# Patient Record
Sex: Male | Born: 1950 | Race: White | Hispanic: No | Marital: Married | Smoking: Never smoker
Health system: Southern US, Community
[De-identification: ages and names within clinical notes are randomized; demographics above are authoritative.]

## PROBLEM LIST (undated history)

## (undated) DIAGNOSIS — N200 Calculus of kidney: Secondary | ICD-10-CM

## (undated) DIAGNOSIS — E119 Type 2 diabetes mellitus without complications: Secondary | ICD-10-CM

## (undated) HISTORY — PX: APPENDECTOMY: SHX54

---

## 2013-01-04 ENCOUNTER — Emergency Department (HOSPITAL_COMMUNITY)
Admission: EM | Admit: 2013-01-04 | Discharge: 2013-01-04 | Disposition: A | Discharge status: Home / Self Care | Payer: PRIVATE HEALTH INSURANCE | Attending: Emergency Medicine | Admitting: Emergency Medicine

## 2013-01-04 ENCOUNTER — Emergency Department (HOSPITAL_COMMUNITY): Payer: PRIVATE HEALTH INSURANCE

## 2013-01-04 ENCOUNTER — Encounter (HOSPITAL_COMMUNITY): Payer: Self-pay | Admitting: *Deleted

## 2013-01-04 DIAGNOSIS — Z7982 Long term (current) use of aspirin: Secondary | ICD-10-CM | POA: Insufficient documentation

## 2013-01-04 DIAGNOSIS — E119 Type 2 diabetes mellitus without complications: Secondary | ICD-10-CM | POA: Insufficient documentation

## 2013-01-04 DIAGNOSIS — Z79899 Other long term (current) drug therapy: Secondary | ICD-10-CM | POA: Insufficient documentation

## 2013-01-04 DIAGNOSIS — N201 Calculus of ureter: Secondary | ICD-10-CM | POA: Insufficient documentation

## 2013-01-04 DIAGNOSIS — Z9089 Acquired absence of other organs: Secondary | ICD-10-CM | POA: Insufficient documentation

## 2013-01-04 HISTORY — DX: Calculus of kidney: N20.0

## 2013-01-04 HISTORY — DX: Type 2 diabetes mellitus without complications: E11.9

## 2013-01-04 LAB — CBC WITH DIFFERENTIAL/PLATELET
Basophils Absolute: 0 10*3/uL (ref 0.0–0.1)
Basophils Relative: 0 % (ref 0–1)
HCT: 39.9 % (ref 39.0–52.0)
Hemoglobin: 14.4 g/dL (ref 13.0–17.0)
Lymphs Abs: 0.7 10*3/uL (ref 0.7–4.0)
MCV: 83.6 fL (ref 78.0–100.0)
Monocytes Relative: 4 % (ref 3–12)
Neutro Abs: 10.6 10*3/uL — ABNORMAL HIGH (ref 1.7–7.7)
RDW: 13.9 % (ref 11.5–15.5)
WBC: 11.8 10*3/uL — ABNORMAL HIGH (ref 4.0–10.5)

## 2013-01-04 LAB — COMPREHENSIVE METABOLIC PANEL
Albumin: 4.3 g/dL (ref 3.5–5.2)
Alkaline Phosphatase: 83 U/L (ref 39–117)
BUN: 24 mg/dL — ABNORMAL HIGH (ref 6–23)
CO2: 24 mEq/L (ref 19–32)
Chloride: 99 mEq/L (ref 96–112)
Creatinine, Ser: 1.53 mg/dL — ABNORMAL HIGH (ref 0.50–1.35)
GFR calc Af Amer: 54 mL/min — ABNORMAL LOW (ref >90)
GFR calc non Af Amer: 47 mL/min — ABNORMAL LOW (ref >90)
Glucose, Bld: 353 mg/dL — ABNORMAL HIGH (ref 70–99)
Total Bilirubin: 1.1 mg/dL (ref 0.3–1.2)

## 2013-01-04 MED ORDER — TAMSULOSIN HCL 0.4 MG PO CAPS: 0.4000 mg | ORAL_CAPSULE | Freq: Every day | ORAL | Status: DC

## 2013-01-04 MED ORDER — ONDANSETRON HCL 4 MG/2ML IJ SOLN
INTRAMUSCULAR | Status: AC
  Filled 2013-01-04: qty 2

## 2013-01-04 MED ORDER — IBUPROFEN 600 MG PO TABS: 600.0000 mg | ORAL_TABLET | Freq: Three times a day (TID) | ORAL | Status: DC | PRN

## 2013-01-04 MED ORDER — ONDANSETRON 8 MG PO TBDP: 8.0000 mg | ORAL_TABLET | Freq: Three times a day (TID) | ORAL | Status: DC | PRN

## 2013-01-04 MED ORDER — FENTANYL CITRATE 0.05 MG/ML IJ SOLN
50.0000 ug | Freq: Once | INTRAMUSCULAR | Status: AC
  Administered 2013-01-04: 50 ug via INTRAVENOUS

## 2013-01-04 MED ORDER — MORPHINE SULFATE 4 MG/ML IJ SOLN
4.0000 mg | Freq: Once | INTRAMUSCULAR | Status: AC
  Administered 2013-01-04: 4 mg via INTRAVENOUS
  Filled 2013-01-04: qty 1

## 2013-01-04 MED ORDER — OXYCODONE-ACETAMINOPHEN 5-325 MG PO TABS: 1.0000 | ORAL_TABLET | ORAL | Status: DC | PRN

## 2013-01-04 MED ORDER — FENTANYL CITRATE 0.05 MG/ML IJ SOLN
INTRAMUSCULAR | Status: AC
  Filled 2013-01-04: qty 2

## 2013-01-04 MED ORDER — ONDANSETRON HCL 4 MG/2ML IJ SOLN
4.0000 mg | Freq: Once | INTRAMUSCULAR | Status: AC
  Administered 2013-01-04: 4 mg via INTRAVENOUS

## 2013-01-04 NOTE — ED Notes (Signed)
Pt stated that he has a hx of kidney stones and has been trying to pass one for 2 days; pain has progressively gotten worse over the last few hours; c/o N/V at present.

## 2013-01-04 NOTE — ED Provider Notes (Signed)
History     CSN: [id number]  Arrival date & time 01/04/13  0046   First MD Initiated Contact with Patient 01/04/13 0115      Chief Complaint  Patient presents with  . Nephrolithiasis    The history is provided by the patient.   patient reports worsening left lower groin pain with radiation to his left flank over the past 2 days.  He has a long-standing history kidney stones and states this feels similar.  His pain worsened this evening and he developed severe nausea and vomiting.  He has no urinary complaints.  No fevers or chills.  His vomitus nonbloody nonbilious.  He states has not seen a urologist in many years regarding this.  His pain at this time is moderate to severe.  Nothing improves or worsens his pain.  Past Medical History  Diagnosis Date  . Kidney stones   . Diabetes mellitus without complication     Past Surgical History  Procedure Laterality Date  . Appendectomy      No family history on file.  History  Substance Use Topics  . Smoking status: Never Smoker   . Smokeless tobacco: Not on file  . Alcohol Use: No      Review of Systems  All other systems reviewed and are negative.    Allergies  Review of patient's allergies indicates no known allergies.  Home Medications   Current Outpatient Rx  Name  Route  Sig  Dispense  Refill  . aspirin EC 81 MG tablet   Oral   Take 81 mg by mouth every morning.         . Cinnamon 500 MG capsule   Oral   Take 500 mg by mouth every morning.         . fish oil-omega-3 fatty acids 1000 MG capsule   Oral   Take 1 g by mouth every morning.         Marland Kitchen glipiZIDE (GLUCOTROL) 5 MG tablet   Oral   Take 10 mg by mouth 2 (two) times daily before a meal.         . metFORMIN (GLUCOPHAGE) 500 MG tablet   Oral   Take 500 mg by mouth every morning.         . niacin 500 MG tablet   Oral   Take 500 mg by mouth every morning.         Marland Kitchen PRAVASTATIN SODIUM PO   Oral   Take 1 tablet by mouth every  evening.         . vitamin C (ASCORBIC ACID) 500 MG tablet   Oral   Take 500 mg by mouth every morning.         Marland Kitchen ibuprofen (ADVIL,MOTRIN) 600 MG tablet   Oral   Take 1 tablet (600 mg total) by mouth every 8 (eight) hours as needed for pain.   15 tablet   0   . ondansetron (ZOFRAN ODT) 8 MG disintegrating tablet   Oral   Take 1 tablet (8 mg total) by mouth every 8 (eight) hours as needed for nausea.   10 tablet   0   . oxyCODONE-acetaminophen (PERCOCET/ROXICET) 5-325 MG per tablet   Oral   Take 1 tablet by mouth every 4 (four) hours as needed for pain.   25 tablet   0   . tamsulosin (FLOMAX) 0.4 MG CAPS   Oral   Take 1 capsule (0.4 mg total) by mouth daily.  10 capsule   0     BP 189/107  Pulse 89  Temp(Src) 97.6 F (36.4 C) (Oral)  Resp 22  SpO2 100%  Physical Exam  Nursing note and vitals reviewed. Constitutional: He is oriented to person, place, and time. He appears well-developed and well-nourished.  HENT:  Head: Normocephalic and atraumatic.  Eyes: EOM are normal.  Neck: Normal range of motion.  Cardiovascular: Normal rate, regular rhythm, normal heart sounds and intact distal pulses.   Pulmonary/Chest: Effort normal and breath sounds normal. No respiratory distress.  Abdominal: Soft. He exhibits no distension. There is no tenderness.  Musculoskeletal: Normal range of motion.  Neurological: He is alert and oriented to person, place, and time.  Skin: Skin is warm and dry.  Psychiatric: He has a normal mood and affect. Judgment normal.    ED Course  Procedures (including critical care time)  Labs Reviewed  CBC WITH DIFFERENTIAL - Abnormal; Notable for the following:    WBC 11.8 (*)    MCHC 36.1 (*)    Platelets 113 (*)    Neutrophils Relative 90 (*)    Lymphocytes Relative 6 (*)    Neutro Abs 10.6 (*)    All other components within normal limits  COMPREHENSIVE METABOLIC PANEL - Abnormal; Notable for the following:    Glucose, Bld 353 (*)     BUN 24 (*)    Creatinine, Ser 1.53 (*)    GFR calc non Af Amer 47 (*)    GFR calc Af Amer 54 (*)    All other components within normal limits  URINALYSIS, ROUTINE W REFLEX MICROSCOPIC   Ct Abdomen Pelvis Wo Contrast  01/04/2013  *RADIOLOGY REPORT*  Clinical Data: Left flank pain and nausea and vomiting.  History kidney stones.  CT ABDOMEN AND PELVIS WITHOUT CONTRAST  Technique:  Multidetector CT imaging of the abdomen and pelvis was performed following the standard protocol without intravenous contrast.  Comparison: None.  Findings: There are two 5 mm stones in the distal left ureter causing left hydronephrosis. The stones are approximately 4-5 cm proximal to the ureterovesicle junction.  There is prominent left hydronephrosis with perinephric soft tissue stranding.  Numerous bilateral renal calculi.  The liver, spleen, pancreas, and biliary tree are normal except for several tiny benign-appearing cysts in this liver. Bilateral adrenal hyperplasia.  The bowel is normal except for multiple diverticula in the left side of the colon.  Appendix has been removed.  No free air or free fluid.  No acute osseous abnormality.  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  IMPRESSION:  1.  Two 5 mm stones obstructing the distal left ureter. 2.  Numerous bilateral renal calculi.   Original Report Authenticated By: Francene Boyers, M.D.      1. Ureteral stone       MDM  Appears to be left-sided ureteral colic.  Pain treated.  CT scan pending.  3:26 AM Feels better at this time. Home with urology follow up.  No urine sample given in the emergency department however his had no urinary symptoms.  Because he feels much better at this time and has no urinary symptoms I don't think is necessary to have the patient wait in the emergency department for another hour and a half awaiting the results of a urinalysis.  Discharge home with urology followup.  He understands to return to the ER  for new or worsening symptoms      Lyanne Co, MD  01/04/13 0701 

## 2013-01-04 NOTE — ED Notes (Signed)
Pt O2 sat decreased to 82% on room air after Fentanyl; pt reports that he is no pain at present; pt placed on Oxygen @4lpm  via [place].

## 2016-07-23 ENCOUNTER — Emergency Department (HOSPITAL_COMMUNITY): Payer: Medicare Other

## 2016-07-23 ENCOUNTER — Encounter (HOSPITAL_COMMUNITY): Payer: Self-pay | Admitting: Radiology

## 2016-07-23 ENCOUNTER — Inpatient Hospital Stay (HOSPITAL_COMMUNITY)
Admission: EM | Admit: 2016-07-23 | Discharge: 2016-08-19 | DRG: 064 | Disposition: E | Payer: Medicare Other | Attending: Neurology | Admitting: Neurology

## 2016-07-23 DIAGNOSIS — J9600 Acute respiratory failure, unspecified whether with hypoxia or hypercapnia: Secondary | ICD-10-CM | POA: Diagnosis not present

## 2016-07-23 DIAGNOSIS — I629 Nontraumatic intracranial hemorrhage, unspecified: Secondary | ICD-10-CM

## 2016-07-23 DIAGNOSIS — I619 Nontraumatic intracerebral hemorrhage, unspecified: Secondary | ICD-10-CM | POA: Diagnosis present

## 2016-07-23 DIAGNOSIS — R1310 Dysphagia, unspecified: Secondary | ICD-10-CM | POA: Diagnosis present

## 2016-07-23 DIAGNOSIS — I610 Nontraumatic intracerebral hemorrhage in hemisphere, subcortical: Secondary | ICD-10-CM

## 2016-07-23 DIAGNOSIS — J690 Pneumonitis due to inhalation of food and vomit: Secondary | ICD-10-CM | POA: Diagnosis not present

## 2016-07-23 DIAGNOSIS — R402114 Coma scale, eyes open, never, 24 hours or more after hospital admission: Secondary | ICD-10-CM | POA: Diagnosis not present

## 2016-07-23 DIAGNOSIS — E876 Hypokalemia: Secondary | ICD-10-CM | POA: Diagnosis present

## 2016-07-23 DIAGNOSIS — Z7189 Other specified counseling: Secondary | ICD-10-CM

## 2016-07-23 DIAGNOSIS — N400 Enlarged prostate without lower urinary tract symptoms: Secondary | ICD-10-CM | POA: Diagnosis present

## 2016-07-23 DIAGNOSIS — R509 Fever, unspecified: Secondary | ICD-10-CM | POA: Diagnosis not present

## 2016-07-23 DIAGNOSIS — G936 Cerebral edema: Secondary | ICD-10-CM | POA: Diagnosis present

## 2016-07-23 DIAGNOSIS — G9340 Encephalopathy, unspecified: Secondary | ICD-10-CM | POA: Diagnosis present

## 2016-07-23 DIAGNOSIS — R402242 Coma scale, best verbal response, confused conversation, at arrival to emergency department: Secondary | ICD-10-CM | POA: Diagnosis present

## 2016-07-23 DIAGNOSIS — D696 Thrombocytopenia, unspecified: Secondary | ICD-10-CM | POA: Diagnosis present

## 2016-07-23 DIAGNOSIS — R739 Hyperglycemia, unspecified: Secondary | ICD-10-CM

## 2016-07-23 DIAGNOSIS — Z7982 Long term (current) use of aspirin: Secondary | ICD-10-CM | POA: Diagnosis not present

## 2016-07-23 DIAGNOSIS — E1159 Type 2 diabetes mellitus with other circulatory complications: Secondary | ICD-10-CM | POA: Diagnosis not present

## 2016-07-23 DIAGNOSIS — E873 Alkalosis: Secondary | ICD-10-CM | POA: Diagnosis not present

## 2016-07-23 DIAGNOSIS — R402214 Coma scale, best verbal response, none, 24 hours or more after hospital admission: Secondary | ICD-10-CM | POA: Diagnosis not present

## 2016-07-23 DIAGNOSIS — Z7984 Long term (current) use of oral hypoglycemic drugs: Secondary | ICD-10-CM

## 2016-07-23 DIAGNOSIS — I161 Hypertensive emergency: Secondary | ICD-10-CM | POA: Diagnosis not present

## 2016-07-23 DIAGNOSIS — E669 Obesity, unspecified: Secondary | ICD-10-CM | POA: Diagnosis present

## 2016-07-23 DIAGNOSIS — I615 Nontraumatic intracerebral hemorrhage, intraventricular: Secondary | ICD-10-CM | POA: Diagnosis not present

## 2016-07-23 DIAGNOSIS — G8194 Hemiplegia, unspecified affecting left nondominant side: Secondary | ICD-10-CM | POA: Diagnosis present

## 2016-07-23 DIAGNOSIS — R402344 Coma scale, best motor response, flexion withdrawal, 24 hours or more after hospital admission: Secondary | ICD-10-CM | POA: Diagnosis not present

## 2016-07-23 DIAGNOSIS — I6200 Nontraumatic subdural hemorrhage, unspecified: Secondary | ICD-10-CM | POA: Diagnosis present

## 2016-07-23 DIAGNOSIS — I10 Essential (primary) hypertension: Secondary | ICD-10-CM

## 2016-07-23 DIAGNOSIS — Z4659 Encounter for fitting and adjustment of other gastrointestinal appliance and device: Secondary | ICD-10-CM

## 2016-07-23 DIAGNOSIS — Z9289 Personal history of other medical treatment: Secondary | ICD-10-CM

## 2016-07-23 DIAGNOSIS — I639 Cerebral infarction, unspecified: Secondary | ICD-10-CM

## 2016-07-23 DIAGNOSIS — E7800 Pure hypercholesterolemia, unspecified: Secondary | ICD-10-CM | POA: Diagnosis present

## 2016-07-23 DIAGNOSIS — N179 Acute kidney failure, unspecified: Secondary | ICD-10-CM | POA: Diagnosis not present

## 2016-07-23 DIAGNOSIS — I618 Other nontraumatic intracerebral hemorrhage: Secondary | ICD-10-CM | POA: Diagnosis present

## 2016-07-23 DIAGNOSIS — G919 Hydrocephalus, unspecified: Secondary | ICD-10-CM | POA: Diagnosis not present

## 2016-07-23 DIAGNOSIS — Z87442 Personal history of urinary calculi: Secondary | ICD-10-CM

## 2016-07-23 DIAGNOSIS — R29721 NIHSS score 21: Secondary | ICD-10-CM | POA: Diagnosis present

## 2016-07-23 DIAGNOSIS — Z452 Encounter for adjustment and management of vascular access device: Secondary | ICD-10-CM

## 2016-07-23 DIAGNOSIS — D649 Anemia, unspecified: Secondary | ICD-10-CM | POA: Diagnosis present

## 2016-07-23 DIAGNOSIS — R197 Diarrhea, unspecified: Secondary | ICD-10-CM | POA: Diagnosis not present

## 2016-07-23 DIAGNOSIS — Z66 Do not resuscitate: Secondary | ICD-10-CM | POA: Diagnosis not present

## 2016-07-23 DIAGNOSIS — R402362 Coma scale, best motor response, obeys commands, at arrival to emergency department: Secondary | ICD-10-CM | POA: Diagnosis present

## 2016-07-23 DIAGNOSIS — J989 Respiratory disorder, unspecified: Secondary | ICD-10-CM | POA: Diagnosis not present

## 2016-07-23 DIAGNOSIS — J9601 Acute respiratory failure with hypoxia: Secondary | ICD-10-CM

## 2016-07-23 DIAGNOSIS — R5081 Fever presenting with conditions classified elsewhere: Secondary | ICD-10-CM | POA: Diagnosis not present

## 2016-07-23 DIAGNOSIS — J9690 Respiratory failure, unspecified, unspecified whether with hypoxia or hypercapnia: Secondary | ICD-10-CM

## 2016-07-23 DIAGNOSIS — E1165 Type 2 diabetes mellitus with hyperglycemia: Secondary | ICD-10-CM | POA: Diagnosis present

## 2016-07-23 DIAGNOSIS — A4901 Methicillin susceptible Staphylococcus aureus infection, unspecified site: Secondary | ICD-10-CM | POA: Diagnosis present

## 2016-07-23 DIAGNOSIS — N289 Disorder of kidney and ureter, unspecified: Secondary | ICD-10-CM | POA: Diagnosis present

## 2016-07-23 DIAGNOSIS — R069 Unspecified abnormalities of breathing: Secondary | ICD-10-CM

## 2016-07-23 DIAGNOSIS — I6789 Other cerebrovascular disease: Secondary | ICD-10-CM | POA: Diagnosis not present

## 2016-07-23 DIAGNOSIS — Z515 Encounter for palliative care: Secondary | ICD-10-CM | POA: Diagnosis not present

## 2016-07-23 DIAGNOSIS — R402434 Glasgow coma scale score 3-8, 24 hours or more after hospital admission: Secondary | ICD-10-CM | POA: Diagnosis not present

## 2016-07-23 DIAGNOSIS — R402132 Coma scale, eyes open, to sound, at arrival to emergency department: Secondary | ICD-10-CM | POA: Diagnosis present

## 2016-07-23 DIAGNOSIS — R000 Tachycardia, unspecified: Secondary | ICD-10-CM | POA: Diagnosis not present

## 2016-07-23 DIAGNOSIS — I671 Cerebral aneurysm, nonruptured: Secondary | ICD-10-CM | POA: Diagnosis present

## 2016-07-23 DIAGNOSIS — E119 Type 2 diabetes mellitus without complications: Secondary | ICD-10-CM

## 2016-07-23 LAB — I-STAT CHEM 8, ED
BUN: 17 mg/dL (ref 6–20)
CREATININE: 0.8 mg/dL (ref 0.61–1.24)
Calcium, Ion: 1.04 mmol/L — ABNORMAL LOW (ref 1.15–1.40)
Chloride: 107 mmol/L (ref 101–111)
GLUCOSE: 364 mg/dL — AB (ref 65–99)
HEMATOCRIT: 35 % — AB (ref 39.0–52.0)
Hemoglobin: 11.9 g/dL — ABNORMAL LOW (ref 13.0–17.0)
POTASSIUM: 3.5 mmol/L (ref 3.5–5.1)
Sodium: 140 mmol/L (ref 135–145)
TCO2: 20 mmol/L (ref 0–100)

## 2016-07-23 LAB — DIFFERENTIAL
BASOS ABS: 0 10*3/uL (ref 0.0–0.1)
Basophils Relative: 0 %
EOS ABS: 0.1 10*3/uL (ref 0.0–0.7)
EOS PCT: 1 %
LYMPHS ABS: 1.2 10*3/uL (ref 0.7–4.0)
Lymphocytes Relative: 16 %
MONO ABS: 0.3 10*3/uL (ref 0.1–1.0)
MONOS PCT: 3 %
Neutro Abs: 6.1 10*3/uL (ref 1.7–7.7)
Neutrophils Relative %: 79 %

## 2016-07-23 LAB — COMPREHENSIVE METABOLIC PANEL
ALT: 14 U/L — ABNORMAL LOW (ref 17–63)
AST: 19 U/L (ref 15–41)
Albumin: 3.9 g/dL (ref 3.5–5.0)
Alkaline Phosphatase: 54 U/L (ref 38–126)
Anion gap: 12 (ref 5–15)
BILIRUBIN TOTAL: 0.7 mg/dL (ref 0.3–1.2)
BUN: 14 mg/dL (ref 6–20)
CO2: 19 mmol/L — ABNORMAL LOW (ref 22–32)
Calcium: 9 mg/dL (ref 8.9–10.3)
Chloride: 105 mmol/L (ref 101–111)
Creatinine, Ser: 0.86 mg/dL (ref 0.61–1.24)
GFR calc Af Amer: >60 mL/min (ref >60)
Glucose, Bld: 360 mg/dL — ABNORMAL HIGH (ref 65–99)
POTASSIUM: 3.5 mmol/L (ref 3.5–5.1)
Sodium: 136 mmol/L (ref 135–145)
TOTAL PROTEIN: 6.7 g/dL (ref 6.5–8.1)

## 2016-07-23 LAB — MRSA PCR SCREENING: MRSA by PCR: NEGATIVE

## 2016-07-23 LAB — CBG MONITORING, ED: Glucose-Capillary: 369 mg/dL — ABNORMAL HIGH (ref 65–99)

## 2016-07-23 LAB — CBC
HEMATOCRIT: 37.5 % — AB (ref 39.0–52.0)
HEMOGLOBIN: 13.2 g/dL (ref 13.0–17.0)
MCH: 29.7 pg (ref 26.0–34.0)
MCHC: 35.2 g/dL (ref 30.0–36.0)
MCV: 84.5 fL (ref 78.0–100.0)
Platelets: 92 10*3/uL — ABNORMAL LOW (ref 150–400)
RBC: 4.44 MIL/uL (ref 4.22–5.81)
RDW: 13.7 % (ref 11.5–15.5)
WBC: 7.7 10*3/uL (ref 4.0–10.5)

## 2016-07-23 LAB — APTT: aPTT: 23 seconds — ABNORMAL LOW (ref 24–36)

## 2016-07-23 LAB — GLUCOSE, CAPILLARY
Glucose-Capillary: 376 mg/dL — ABNORMAL HIGH (ref 65–99)
Glucose-Capillary: 306 mg/dL — ABNORMAL HIGH (ref 65–99)

## 2016-07-23 LAB — ETHANOL: Alcohol, Ethyl (B): <5 mg/dL (ref <5)

## 2016-07-23 LAB — PROTIME-INR
INR: 0.99
Prothrombin Time: 13.1 seconds (ref 11.4–15.2)

## 2016-07-23 LAB — I-STAT TROPONIN, ED: TROPONIN I, POC: 0 ng/mL (ref 0.00–0.08)

## 2016-07-23 MED ORDER — SENNOSIDES-DOCUSATE SODIUM 8.6-50 MG PO TABS
1.0000 | ORAL_TABLET | Freq: Two times a day (BID) | ORAL | Status: DC
  Administered 2016-07-25 – 2016-07-27 (×5): 1 via ORAL
  Filled 2016-07-23 (×7): qty 1

## 2016-07-23 MED ORDER — NICARDIPINE HCL IN NACL 20-0.86 MG/200ML-% IV SOLN
3.0000 mg/h | Freq: Once | INTRAVENOUS | Status: AC
  Administered 2016-07-23: 5 mg/h via INTRAVENOUS
  Filled 2016-07-23: qty 200

## 2016-07-23 MED ORDER — ONDANSETRON HCL 4 MG/2ML IJ SOLN
INTRAMUSCULAR | Status: AC
  Filled 2016-07-23: qty 2

## 2016-07-23 MED ORDER — ACETAMINOPHEN 650 MG RE SUPP
650.0000 mg | RECTAL | Status: DC | PRN
  Administered 2016-07-24 – 2016-07-25 (×2): 650 mg via RECTAL
  Filled 2016-07-23 (×2): qty 1

## 2016-07-23 MED ORDER — NICARDIPINE HCL IN NACL 20-0.86 MG/200ML-% IV SOLN
3.0000 mg/h | INTRAVENOUS | Status: DC
  Administered 2016-07-23: 12.5 mg/h via INTRAVENOUS
  Administered 2016-07-24: 10 mg/h via INTRAVENOUS
  Administered 2016-07-24: 3 mg/h via INTRAVENOUS
  Administered 2016-07-24: 10 mg/h via INTRAVENOUS
  Administered 2016-07-24: 8 mg/h via INTRAVENOUS
  Administered 2016-07-24: 10 mg/h via INTRAVENOUS
  Administered 2016-07-24: 3 mg/h via INTRAVENOUS
  Administered 2016-07-24: 10 mg/h via INTRAVENOUS
  Administered 2016-07-25 (×2): 5 mg/h via INTRAVENOUS
  Administered 2016-07-25: 3 mg/h via INTRAVENOUS
  Filled 2016-07-23 (×12): qty 200

## 2016-07-23 MED ORDER — FAMOTIDINE IN NACL 20-0.9 MG/50ML-% IV SOLN
20.0000 mg | Freq: Two times a day (BID) | INTRAVENOUS | Status: DC
  Administered 2016-07-23 – 2016-07-28 (×11): 20 mg via INTRAVENOUS
  Filled 2016-07-23 (×11): qty 50

## 2016-07-23 MED ORDER — SODIUM CHLORIDE 0.9 % IV SOLN
INTRAVENOUS | Status: DC
  Administered 2016-07-23: 2.5 [IU]/h via INTRAVENOUS
  Administered 2016-07-25: 1.8 [IU]/h via INTRAVENOUS
  Administered 2016-07-25: 1.7 [IU]/h via INTRAVENOUS
  Administered 2016-07-25: 0.7 [IU]/h via INTRAVENOUS
  Filled 2016-07-23: qty 2.5

## 2016-07-23 MED ORDER — ACETAMINOPHEN 325 MG PO TABS
650.0000 mg | ORAL_TABLET | ORAL | Status: DC | PRN
  Administered 2016-07-25 – 2016-07-26 (×2): 650 mg via ORAL
  Filled 2016-07-23 (×2): qty 2

## 2016-07-23 MED ORDER — INSULIN ASPART 100 UNIT/ML ~~LOC~~ SOLN
12.0000 [IU] | Freq: Once | SUBCUTANEOUS | Status: AC
  Administered 2016-07-23: 12 [IU] via SUBCUTANEOUS
  Filled 2016-07-23: qty 1

## 2016-07-23 MED ORDER — STROKE: EARLY STAGES OF RECOVERY BOOK
Freq: Once | Status: DC
  Filled 2016-07-23: qty 1

## 2016-07-23 MED ORDER — ONDANSETRON HCL 4 MG/2ML IJ SOLN
4.0000 mg | Freq: Once | INTRAMUSCULAR | Status: AC
  Administered 2016-07-23: 4 mg via INTRAVENOUS

## 2016-07-23 MED ORDER — INSULIN ASPART 100 UNIT/ML ~~LOC~~ SOLN
2.0000 [IU] | SUBCUTANEOUS | Status: DC
  Administered 2016-07-23: 6 [IU] via SUBCUTANEOUS

## 2016-07-23 NOTE — Progress Notes (Signed)
Code stroke called at 1711, [facility] at 1735.  AS per EMS patient was in a deer stand with his granddaughter, LSN [id number] when he suddenly dropped his gun and was noted  To be leaning to the left side with weakness, gaze and c/o intense pain behind his left eye.  BP in the field was noted to be 202/146, HR 76 NSR, CBG 361.  Vomited x 2 in truck and given 4 mg zofran, unrelieved.  NIHSS 21

## 2016-07-23 NOTE — ED Provider Notes (Signed)
[facility]Y DEPT Provider Note   CSN: [id number] Arrival date & time: 07/22/2016  1735   An emergency department physician performed an initial assessment on this suspected stroke patient at 1136.  History   Chief Complaint Chief Complaint  Patient presents with  . Code Stroke    HPI Seth Potter is a 65 y.o. male.  Patient with acute onset left sided weakness shortly prior to arrival.  Patient was w family member, in tree stand hunting, when had acute onset slurred speech, and left sided weakness.  Patient also c/o acute onset headache which is frontal, right, mod-severe, constant.  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 No current anticoagulant use. Was in normal well state of health immediately prior. Symptoms ongoing approx 1 hour.  No vomiting. No fevers. No fall or trauma. EMS noted elevated blood pressures, 210/120 range.    The history is provided by the patient, the spouse and the EMS personnel. The history is limited by the condition of the patient.    Past Medical History:  Diagnosis Date  . Diabetes mellitus without complication (HCC)   . Kidney stones     There are no active problems to display for this patient.   Past Surgical History:  Procedure Laterality Date  . APPENDECTOMY         Home Medications    Prior to Admission medications   Medication Sig Start Date End Date Taking? Authorizing Provider  aspirin EC 81 MG tablet Take 81 mg by mouth every morning.    Historical Provider, MD  Cinnamon 500 MG capsule Take 500 mg by mouth every morning.    Historical Provider, MD  fish oil-omega-3 fatty acids 1000 MG capsule Take 1 g by mouth every morning.    Historical Provider, MD  glipiZIDE (GLUCOTROL) 5 MG tablet Take 10 mg by mouth 2 (two) times daily before a meal.    Historical Provider, MD  ibuprofen (ADVIL,MOTRIN) 600 MG tablet Take 1 tablet (600 mg total) by mouth every 8 (eight) hours as needed for pain. 01/04/13   Azalia BilisKevin Campos, MD  metFORMIN (GLUCOPHAGE)  500 MG tablet Take 500 mg by mouth every morning.    Historical Provider, MD  niacin 500 MG tablet Take 500 mg by mouth every morning.    Historical Provider, MD  ondansetron (ZOFRAN ODT) 8 MG disintegrating tablet Take 1 tablet (8 mg total) by mouth every 8 (eight) hours as needed for nausea. 01/04/13   Azalia BilisKevin Campos, MD  oxyCODONE-acetaminophen (PERCOCET/ROXICET) 5-325 MG per tablet Take 1 tablet by mouth every 4 (four) hours as needed for pain. 01/04/13   Azalia BilisKevin Campos, MD  PRAVASTATIN SODIUM PO Take 1 tablet by mouth every evening.    Historical Provider, MD  tamsulosin (FLOMAX) 0.4 MG CAPS Take 1 capsule (0.4 mg total) by mouth daily. 01/04/13   Azalia BilisKevin Campos, MD  vitamin C (ASCORBIC ACID) 500 MG tablet Take 500 mg by mouth every morning.    Historical Provider, MD    Family History No family history on file.  Social History Social History  Substance Use Topics  . Smoking status: Never Smoker  . Smokeless tobacco: Not on file  . Alcohol use No     Allergies   Review of patient's allergies indicates no known allergies.   Review of Systems Review of Systems  Unable to perform ROS: Patient unresponsive  Constitutional: Negative for fever.  Neurological: Positive for headaches.  level 5 caveat - patient unable to respond to ros questions  Physical Exam Updated Vital Signs BP 180/67   Pulse 92   Resp 18   Wt 81.5 kg   SpO2 100%   Physical Exam  Constitutional: He appears well-developed and well-nourished. No distress.  HENT:  Head: Atraumatic.  Mouth/Throat: Oropharynx is clear and moist.  Eyes: Pupils are equal, round, and reactive to light.  Rightward gaze  Neck: Neck supple. No tracheal deviation present.  No bruit  Cardiovascular: Normal rate, regular rhythm, normal heart sounds and intact distal pulses.   Pulmonary/Chest: Effort normal and breath sounds normal. No accessory muscle usage. No respiratory distress. He exhibits no tenderness.  Abdominal: Soft. He  exhibits no distension. There is no tenderness.  Musculoskeletal: He exhibits no edema.  Neurological: He is alert. A cranial nerve deficit is present.  Awake and alert. Dysarthric/difficult to understand speech. Marked left weakness, 1/5. Strength normal on right.   Skin: Skin is warm and dry.  Psychiatric:  Alert, anxious appearing.   Nursing note and vitals reviewed.    ED Treatments / Results  Labs (all labs ordered are listed, but only abnormal results are displayed) Results for orders placed or performed during the hospital encounter of 07/29/2016  CBC  Result Value Ref Range   WBC 7.7 4.0 - 10.5 K/uL   RBC 4.44 4.22 - 5.81 MIL/uL   Hemoglobin 13.2 13.0 - 17.0 g/dL   HCT 16.137.5 (L) 09.639.0 - 04.552.0 %   MCV 84.5 78.0 - 100.0 fL   MCH 29.7 26.0 - 34.0 pg   MCHC 35.2 30.0 - 36.0 g/dL   RDW 40.913.7 81.111.5 - 91.415.5 %   Platelets PENDING 150 - 400 K/uL  Differential  Result Value Ref Range   Neutrophils Relative % 79 %   Neutro Abs 6.1 1.7 - 7.7 K/uL   Lymphocytes Relative 16 %   Lymphs Abs 1.2 0.7 - 4.0 K/uL   Monocytes Relative 3 %   Monocytes Absolute 0.3 0.1 - 1.0 K/uL   Eosinophils Relative 1 %   Eosinophils Absolute 0.1 0.0 - 0.7 K/uL   Basophils Relative 0 %   Basophils Absolute 0.0 0.0 - 0.1 K/uL  CBG monitoring, ED  Result Value Ref Range   Glucose-Capillary 369 (H) 65 - 99 mg/dL   Comment 1 Notify RN    Comment 2 Call MD NNP PA CNM    Comment 3 Document in Chart   I-Stat Chem 8, ED  ([facility])  Result Value Ref Range   Sodium 140 135 - 145 mmol/L   Potassium 3.5 3.5 - 5.1 mmol/L   Chloride 107 101 - 111 mmol/L   BUN 17 6 - 20 mg/dL   Creatinine, Ser 7.820.80 0.61 - 1.24 mg/dL   Glucose, Bld 956364 (H) 65 - 99 mg/dL   Calcium, Ion 2.131.04 (L) 1.15 - 1.40 mmol/L   TCO2 20 0 - 100 mmol/L   Hemoglobin 11.9 (L) 13.0 - 17.0 g/dL   HCT 08.635.0 (L) 57.839.0 - 46.952.0 %  I-stat troponin, ED ([facility])  Result Value Ref Range   Troponin i, poc 0.00 0.00 - 0.08 ng/mL   Comment 3            Ct Head Code Stroke W/o Cm  Result Date: 07/27/2016 CLINICAL DATA:  Code stroke. Initial evaluation for acute left-sided weakness. EXAM: CT HEAD WITHOUT CONTRAST TECHNIQUE: Contiguous axial images were obtained from the base of the skull through the vertex without intravenous contrast. COMPARISON:  None. FINDINGS: Brain: Large  intraparenchymal hemorrhage centered within the mid right cerebral hemisphere, likely at the origin of right basal ganglia, measures 64 x 53 x 69 mm (estimated volume 115 cc). Localized rim of edema about the hemorrhage. Intraventricular extension with blood in the lateral ventricles and third ventricle. Possible trace subdural extension as well with a small extra-axial hemorrhage at the right frontoparietal convexity measuring up to 3 mm (series 201, image 22). Associated mass effect with up to 14 mm of right-to-left shift. Dilatation of the temporal horns of the lateral ventricles without hydrocephalus. Underlying cerebral atrophy with chronic microvascular ischemic disease. No evidence for additional acute ischemic infarct. Gray-white matter differentiation otherwise maintained. No mass lesion. Vascular: No hyperdense vessel identified. Scattered vascular calcifications within the carotid siphons and distal vertebral arteries. Skull: Scalp soft tissues demonstrate no acute abnormality. Calvarium intact. Sinuses/Orbits: Globes and orbital soft tissues within normal limits. Visualized paranasal sinuses and mastoid air cells are clear. IMPRESSION: 1. Large acute intraparenchymal hematoma measuring 64 x 53 x 69 mm (estimated volume 115 cc) centered near the right basal ganglia. Localized mass effect with up to 14 mm of right-to-left shift. 2. Associated intraventricular extension with blood in the lateral and third ventricles. Dilatation of the temporal horns of both lateral ventricles without hydrocephalus. 3. Probable subdural extension with tiny right subdural hematoma  measuring up to 3 mm. 4. Underlying chronic microvascular ischemic disease. Critical Value/emergent results were called by telephone at the time of interpretation on 08/14/2016 at 5:56 pm to Dr. Nicholas Lose , who verbally acknowledged these results. Electronically Signed   By: Rise Mu M.D.   On: 08/14/2016 18:07    EKG  EKG Interpretation  Date/Time:  Saturday 2016/08/14 17:59:59 EDT Ventricular Rate:  96 PR Interval:    QRS Duration: 96 QT Interval:  385 QTC Calculation: 487 R Axis:   41 Text Interpretation:  Sinus rhythm Borderline prolonged QT interval Nonspecific ST abnormality No previous tracing Confirmed by Denton Lank  MD, Caryn Bee ([id number]) on 2016-08-14 6:03:30 PM       Radiology Ct Head Code Stroke W/o Cm  Result Date: 2016-08-14 CLINICAL DATA:  Code stroke. Initial evaluation for acute left-sided weakness. EXAM: CT HEAD WITHOUT CONTRAST TECHNIQUE: Contiguous axial images were obtained from the base of the skull through the vertex without intravenous contrast. COMPARISON:  None. FINDINGS: Brain: Large intraparenchymal hemorrhage centered within the mid right cerebral hemisphere, likely at the origin of right basal ganglia, measures 64 x 53 x 69 mm (estimated volume 115 cc). Localized rim of edema about the hemorrhage. Intraventricular extension with blood in the lateral ventricles and third ventricle. Possible trace subdural extension as well with a small extra-axial hemorrhage at the right frontoparietal convexity measuring up to 3 mm (series 201, image 22). Associated mass effect with up to 14 mm of right-to-left shift. Dilatation of the temporal horns of the lateral ventricles without hydrocephalus. Underlying cerebral atrophy with chronic microvascular ischemic disease. No evidence for additional acute ischemic infarct. Gray-white matter differentiation otherwise maintained. No mass lesion. Vascular: No hyperdense vessel identified. Scattered vascular calcifications within the  carotid siphons and distal vertebral arteries. Skull: Scalp soft tissues demonstrate no acute abnormality. Calvarium intact. Sinuses/Orbits: Globes and orbital soft tissues within normal limits. Visualized paranasal sinuses and mastoid air cells are clear. IMPRESSION: 1. Large acute intraparenchymal hematoma measuring 64 x 53 x 69 mm (estimated volume 115 cc) centered near the right basal ganglia. Localized mass effect with up to 14 mm of right-to-left shift. 2. Associated intraventricular extension  with blood in the lateral and third ventricles. Dilatation of the temporal horns of both lateral ventricles without hydrocephalus. 3. Probable subdural extension with tiny right subdural hematoma measuring up to 3 mm. 4. Underlying chronic microvascular ischemic disease. Critical Value/emergent results were called by telephone at the time of interpretation on Aug 13, 2016 at 5:56 pm to Dr. Nicholas Lose , who verbally acknowledged these results. Electronically Signed   By: Rise Mu M.D.   On: 13-Aug-2016 18:07    Procedures Procedures (including critical care time)  Medications Ordered in ED Medications  nicardipine (CARDENE) 20mg  in 0.86% saline IV infusion (0.1 mg/ml) (10 mg/hr Intravenous Rate/Dose Change 13-Aug-2016 1815)  ondansetron (ZOFRAN) injection 4 mg (4 mg Intravenous Given August 13, 2016 1805)  insulin aspart (novoLOG) injection 12 Units (12 Units Subcutaneous Given 08/13/16 1823)     Initial Impression / Assessment and Plan / ED Course  I have reviewed the triage vital signs and the nursing notes.  Pertinent labs & imaging results that were available during my care of the patient were reviewed by me and considered in my medical decision making (see chart for details).  Clinical Course   Iv ns. Continuous pulse ox and monitor.   Code stroke on arrival.  Stat CT. Labs.  Neurosurgery consulted when patient still in CT.   Discussed with Dr Wynetta Emery who reviewed CT and indicates to have  neurology admit.  Iv nicardipine drip.   Neurology consulted.  Recheck pt, remains alert, tries to follow commands.   CRITICAL CARE  RE hemorrhagic stroke with intraventricular hemorrhage, severe hypertension, requiring emergent iv cardene drop/bp control,  Performed by: Suzi Roots Total critical care time: 35 minutes Critical care time was exclusive of separately billable procedures and treating other patients. Critical care was necessary to treat or prevent imminent or life-threatening deterioration. Critical care was time spent personally by me on the following activities: development of treatment plan with patient and/or surrogate as well as nursing, discussions with consultants, evaluation of patient's response to treatment, examination of patient, obtaining history from patient or surrogate, ordering and performing treatments and interventions, ordering and review of laboratory studies, ordering and review of radiographic studies, pulse oximetry and re-evaluation of patient's condition.   Final Clinical Impressions(s) / ED Diagnoses   Final diagnoses:  None    New Prescriptions New Prescriptions   No medications on file     Cathren Laine, MD Aug 13, 2016 1835

## 2016-07-23 NOTE — ED Triage Notes (Signed)
Pt brought in the ed after having a sudden onset of left sided weakness and gaze while hunting with grand daughter. Pt arrives as code stroke.

## 2016-07-23 NOTE — H&P (Signed)
Admission H&P  Seth PerfectRichard M Potter is an 65 y.o. male.   Chief Complaint: ICH HPI: Slumped over during hunting and became unresponsive.  BP 220/115 upon arrival.  No prior history of hypertension.  Did have history of DM and high cholesterol.  CT shows large BG ICH with 14 mm midline shift, IVH, but no hydrocephalus.  Neurosurgery was called and they did not recommend any acute surgical intervention.  Past Medical History:  Diagnosis Date  . Diabetes mellitus without complication (HCC)   . Kidney stones     Past Surgical History:  Procedure Laterality Date  . APPENDECTOMY      No family history on file. Social History:  reports that he has never smoked. He does not have any smokeless tobacco history on file. He reports that he does not drink alcohol or use drugs.  Allergies: No Known Allergies   (Not in a hospital admission)  Results for orders placed or performed during the hospital encounter of 08/16/2016 (from the past 48 hour(s))  CBG monitoring, ED     Status: Abnormal   Collection Time: 07/29/2016  5:39 PM  Result Value Ref Range   Glucose-Capillary 369 (H) 65 - 99 mg/dL   Comment 1 Notify RN    Comment 2 Call MD NNP PA CNM    Comment 3 Document in Chart   CBC     Status: Abnormal (Preliminary result)   Collection Time: 07/24/2016  5:40 PM  Result Value Ref Range   WBC 7.7 4.0 - 10.5 K/uL   RBC 4.44 4.22 - 5.81 MIL/uL   Hemoglobin 13.2 13.0 - 17.0 g/dL   HCT 14.737.5 (L) 82.939.0 - 56.252.0 %   MCV 84.5 78.0 - 100.0 fL   MCH 29.7 26.0 - 34.0 pg   MCHC 35.2 30.0 - 36.0 g/dL   RDW 13.013.7 86.511.5 - 78.415.5 %   Platelets PENDING 150 - 400 K/uL  Differential     Status: None   Collection Time: 08/10/2016  5:40 PM  Result Value Ref Range   Neutrophils Relative % 79 %   Neutro Abs 6.1 1.7 - 7.7 K/uL   Lymphocytes Relative 16 %   Lymphs Abs 1.2 0.7 - 4.0 K/uL   Monocytes Relative 3 %   Monocytes Absolute 0.3 0.1 - 1.0 K/uL   Eosinophils Relative 1 %   Eosinophils Absolute 0.1 0.0 - 0.7 K/uL   Basophils Relative 0 %   Basophils Absolute 0.0 0.0 - 0.1 K/uL  I-Stat Chem 8, ED  ([facility])     Status: Abnormal   Collection Time: 08/16/2016  5:43 PM  Result Value Ref Range   Sodium 140 135 - 145 mmol/L   Potassium 3.5 3.5 - 5.1 mmol/L   Chloride 107 101 - 111 mmol/L   BUN 17 6 - 20 mg/dL   Creatinine, Ser 6.960.80 0.61 - 1.24 mg/dL   Glucose, Bld 295364 (H) 65 - 99 mg/dL   Calcium, Ion 2.841.04 (L) 1.15 - 1.40 mmol/L   TCO2 20 0 - 100 mmol/L   Hemoglobin 11.9 (L) 13.0 - 17.0 g/dL   HCT 13.235.0 (L) 44.039.0 - 10.252.0 %  I-stat troponin, ED ([facility])     Status: None   Collection Time: 07/30/2016  5:43 PM  Result Value Ref Range   Troponin i, poc 0.00 0.00 - 0.08 ng/mL   Comment 3            Comment: Due to the release kinetics of  cTnI, a negative result within the first hours of the onset of symptoms does not rule out myocardial infarction with certainty. If myocardial infarction is still suspected, repeat the test at appropriate intervals.    Ct Head Code Stroke W/o Cm  Result Date: 08/15/2016 CLINICAL DATA:  Code stroke. Initial evaluation for acute left-sided weakness. EXAM: CT HEAD WITHOUT CONTRAST TECHNIQUE: Contiguous axial images were obtained from the base of the skull through the vertex without intravenous contrast. COMPARISON:  None. FINDINGS: Brain: Large intraparenchymal hemorrhage centered within the mid right cerebral hemisphere, likely at the origin of right basal ganglia, measures 64 x 53 x 69 mm (estimated volume 115 cc). Localized rim of edema about the hemorrhage. Intraventricular extension with blood in the lateral ventricles and third ventricle. Possible trace subdural extension as well with a small extra-axial hemorrhage at the right frontoparietal convexity measuring up to 3 mm (series 201, image 22). Associated mass effect with up to 14 mm of right-to-left shift. Dilatation of the temporal horns of the lateral ventricles without hydrocephalus. Underlying cerebral  atrophy with chronic microvascular ischemic disease. No evidence for additional acute ischemic infarct. Gray-white matter differentiation otherwise maintained. No mass lesion. Vascular: No hyperdense vessel identified. Scattered vascular calcifications within the carotid siphons and distal vertebral arteries. Skull: Scalp soft tissues demonstrate no acute abnormality. Calvarium intact. Sinuses/Orbits: Globes and orbital soft tissues within normal limits. Visualized paranasal sinuses and mastoid air cells are clear. IMPRESSION: 1. Large acute intraparenchymal hematoma measuring 64 x 53 x 69 mm (estimated volume 115 cc) centered near the right basal ganglia. Localized mass effect with up to 14 mm of right-to-left shift. 2. Associated intraventricular extension with blood in the lateral and third ventricles. Dilatation of the temporal horns of both lateral ventricles without hydrocephalus. 3. Probable subdural extension with tiny right subdural hematoma measuring up to 3 mm. 4. Underlying chronic microvascular ischemic disease. Critical Value/emergent results were called by telephone at the time of interpretation on 08/09/2016 at 5:56 pm to Dr. Nicholas LoseEshraghi , who verbally acknowledged these results. Electronically Signed   By: Rise MuBenjamin  McClintock M.D.   On: Feb 02, 2016 18:07    ROS  Physical Examination: Blood pressure 180/67, pulse 92, resp. rate 18, weight 81.5 kg (179 lb 10.8 oz), SpO2 100 %.  HEENT - Normocephalic, without obvious abnormality, atraumatic, conjunctivae/corneas clear. PERRL, EOM's intact. Fundi benign., normal TM's and external ear canals both ears, normal, no erythema or exudates noted. Teeth and gums normal. Cardiovascular - regular rate and rhythm, S1, S2 normal, no murmur, click, rub or gallop Lungs - chest clear, no wheezing, rales, normal symmetric air entry, Heart exam - S1, S2 normal, no murmur, no gallop, rate regular Abdomen - soft, non-tender; bowel sounds normal; no masses,  no  organomegaly Extremities - no joint deformities, effusion, or inflammation     Neurologic Examination:  Reduced level of alertness. Right gaze deviation. Left lower facial droop. Minimal withdrawal to pain left UE and LE.   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  He does not have hydrocephalus yet, so IVD is not indicated but will monitor.   Control BP with Cardene gtt 5-15 mg/hr for goal SBP <160  Control Glucose as it may contribute to hematoma expansion - 12 Units insulin given in ER  Repeat CT Brain in am  No immediate need for intubation at this time.   [facility]ast[facility] Behavioral HealthESHRAGHI, Allyn Bertoni,MD 08/09/2016 6:28 PM

## 2016-07-23 NOTE — ED Notes (Signed)
Family at bedside. 

## 2016-07-23 NOTE — ED Notes (Signed)
Report called to 98M ICU RN. Will transport pt at 715 per 98M.

## 2016-07-23 NOTE — Progress Notes (Signed)
   01-01-16 1800  Clinical Encounter Type  Visited With Patient and family together  Visit Type Code  Referral From Nurse  Spiritual Encounters  Spiritual Needs Emotional  Stress Factors  Patient Stress Factors None identified  Family Stress Factors Not reviewed    Chaplain responded to code stroke in Ed. Pt seems to have brain bleed and family is present. Facilitated ministry of hospitality and consult room rotation to trauma A

## 2016-07-24 ENCOUNTER — Inpatient Hospital Stay (HOSPITAL_COMMUNITY): Payer: Medicare Other

## 2016-07-24 LAB — GLUCOSE, CAPILLARY
GLUCOSE-CAPILLARY: 158 mg/dL — AB (ref 65–99)
GLUCOSE-CAPILLARY: 145 mg/dL — AB (ref 65–99)
GLUCOSE-CAPILLARY: 135 mg/dL — AB (ref 65–99)
GLUCOSE-CAPILLARY: 220 mg/dL — AB (ref 65–99)
GLUCOSE-CAPILLARY: 192 mg/dL — AB (ref 65–99)
GLUCOSE-CAPILLARY: 138 mg/dL — AB (ref 65–99)
GLUCOSE-CAPILLARY: 149 mg/dL — AB (ref 65–99)
GLUCOSE-CAPILLARY: 164 mg/dL — AB (ref 65–99)
GLUCOSE-CAPILLARY: 169 mg/dL — AB (ref 65–99)
GLUCOSE-CAPILLARY: 156 mg/dL — AB (ref 65–99)
Glucose-Capillary: 118 mg/dL — ABNORMAL HIGH (ref 65–99)
Glucose-Capillary: 123 mg/dL — ABNORMAL HIGH (ref 65–99)
Glucose-Capillary: 125 mg/dL — ABNORMAL HIGH (ref 65–99)
Glucose-Capillary: 127 mg/dL — ABNORMAL HIGH (ref 65–99)
Glucose-Capillary: 128 mg/dL — ABNORMAL HIGH (ref 65–99)
Glucose-Capillary: 140 mg/dL — ABNORMAL HIGH (ref 65–99)
Glucose-Capillary: 141 mg/dL — ABNORMAL HIGH (ref 65–99)
Glucose-Capillary: 157 mg/dL — ABNORMAL HIGH (ref 65–99)
Glucose-Capillary: 178 mg/dL — ABNORMAL HIGH (ref 65–99)
Glucose-Capillary: 184 mg/dL — ABNORMAL HIGH (ref 65–99)
Glucose-Capillary: 222 mg/dL — ABNORMAL HIGH (ref 65–99)
Glucose-Capillary: 244 mg/dL — ABNORMAL HIGH (ref 65–99)
Glucose-Capillary: 250 mg/dL — ABNORMAL HIGH (ref 65–99)
Glucose-Capillary: 255 mg/dL — ABNORMAL HIGH (ref 65–99)
Glucose-Capillary: 256 mg/dL — ABNORMAL HIGH (ref 65–99)
Glucose-Capillary: 298 mg/dL — ABNORMAL HIGH (ref 65–99)

## 2016-07-24 LAB — RAPID URINE DRUG SCREEN, HOSP PERFORMED
AMPHETAMINES: NOT DETECTED
Barbiturates: NOT DETECTED
Benzodiazepines: NOT DETECTED
Cocaine: NOT DETECTED
OPIATES: NOT DETECTED
Tetrahydrocannabinol: NOT DETECTED

## 2016-07-24 LAB — URINALYSIS, ROUTINE W REFLEX MICROSCOPIC
Bilirubin Urine: NEGATIVE
Glucose, UA: 250 mg/dL — AB
Hgb urine dipstick: NEGATIVE
Ketones, ur: NEGATIVE mg/dL
Leukocytes, UA: NEGATIVE
NITRITE: NEGATIVE
PH: 5 (ref 5.0–8.0)
Protein, ur: NEGATIVE mg/dL
SPECIFIC GRAVITY, URINE: 1.024 (ref 1.005–1.030)

## 2016-07-24 LAB — SODIUM
SODIUM: 142 mmol/L (ref 135–145)
SODIUM: 145 mmol/L (ref 135–145)
Sodium: 147 mmol/L — ABNORMAL HIGH (ref 135–145)

## 2016-07-24 MED ORDER — WHITE PETROLATUM GEL
Status: AC
  Filled 2016-07-24: qty 1

## 2016-07-24 MED ORDER — MANNITOL 20 % IV SOLN
80.0000 g | Status: AC
  Filled 2016-07-24: qty 400

## 2016-07-24 MED ORDER — MANNITOL 25 % IV SOLN
INTRAVENOUS | Status: AC
  Administered 2016-07-24: 75 g
  Filled 2016-07-24: qty 50

## 2016-07-24 MED ORDER — SODIUM CHLORIDE 3 % IV SOLN
INTRAVENOUS | Status: DC
  Administered 2016-07-24: 30 mL/h via INTRAVENOUS
  Filled 2016-07-24 (×3): qty 500

## 2016-07-24 MED ORDER — FENTANYL CITRATE (PF) 100 MCG/2ML IJ SOLN
25.0000 ug | INTRAMUSCULAR | Status: DC | PRN
  Administered 2016-07-24 – 2016-08-04 (×11): 25 ug via INTRAVENOUS
  Filled 2016-07-24 (×13): qty 2

## 2016-07-24 NOTE — Progress Notes (Signed)
STROKE TEAM PROGRESS NOTE   HISTORY OF PRESENT ILLNESS (per record) ICH HPI: Slumped over during hunting and became unresponsive.  BP 220/115 upon arrival.  No prior history of hypertension.  Did have history of DM and high cholesterol.  CT shows large BG ICH with 14 mm midline shift, IVH, but no hydrocephalus.  Neurosurgery was called and they did not recommend any acute surgical intervention.   SUBJECTIVE (INTERVAL HISTORY) His wife and daughter as well as RN were at the bedside.  Overall his condition is gradually worsening. During the course of the early morning, patient has not opened eyes as well and has apparently become less responsive.  STAT CT of head ordered   OBJECTIVE Temp:  [98.3 F (36.8 C)-101.3 F (38.5 C)] 101.3 F (38.5 C) (11/05 0800) Pulse Rate:  [74-120] 105 (11/05 0700) Cardiac Rhythm: Normal sinus rhythm (11/04 1933) Resp:  [11-56] 29 (11/05 0700) BP: (114-216)/(44-114) 147/55 (11/05 0700) SpO2:  [94 %-100 %] 94 % (11/05 0700) Weight:  [81.5 kg (179 lb 10.8 oz)] 81.5 kg (179 lb 10.8 oz) (11/04 1808)  CBC:   Recent Labs Lab 01-04-2016 1740 01-04-2016 1743  WBC 7.7  --   NEUTROABS 6.1  --   HGB 13.2 11.9*  HCT 37.5* 35.0*  MCV 84.5  --   PLT 92*  --     Basic Metabolic Panel:   Recent Labs Lab 01-04-2016 1740 01-04-2016 1743  NA 136 140  K 3.5 3.5  CL 105 107  CO2 19*  --   GLUCOSE 360* 364*  BUN 14 17  CREATININE 0.86 0.80  CALCIUM 9.0  --     Lipid Panel: No results found for: CHOL, TRIG, HDL, CHOLHDL, VLDL, LDLCALC HgbA1c: No results found for: HGBA1C Urine Drug Screen: No results found for: LABOPIA, COCAINSCRNUR, LABBENZ, AMPHETMU, THCU, LABBARB    IMAGING  Ct Head Code Stroke W/o Cm 08/02/2016 1. Large acute intraparenchymal hematoma measuring 64 x 53 x 69 mm (estimated volume 115 cc) centered near the right basal ganglia. Localized mass effect with up to 14 mm of right-to-left shift.  2. Associated intraventricular extension with blood  in the lateral and third ventricles. Dilatation of the temporal horns of both lateral ventricles without hydrocephalus.  3. Probable subdural extension with tiny right subdural hematoma measuring up to 3 mm.  4. Underlying chronic microvascular ischemic disease.    CT Head WO Contrast - 11/5 1. Unchanged size of large right basal ganglia region parenchymal hemorrhage. Slightly increased edema without increased midline shift. 2. Increased intraventricular hemorrhage with mild interval left lateral ventricular dilatation concerning for developing hydrocephalus.   PHYSICAL EXAM  HEENT - Normocephalic, without obvious abnormality, atraumatic, conjunctivae/corneas clear. PERRL, EOM's intact.  Cardiovascular - regular rate and rhythm, S1, S2 normal, no murmur, click, rub or gallop Lungs - chest clear, no wheezing, rales, normal symmetric air entry Abdomen - soft, non-tender; bowel sounds normal; no masses,  no organomegaly Extremities - no joint deformities, effusion, or inflammation   Neurologic Examination: Reduced level of alertness.  However, did still follow commands on the right.  Does not open eyes (lid apraxia?)  Pupils equal and sluggish.  Right gaze deviation. Left corneal weak and right corneal intact; Left lower facial droop.  Spontaneous cough  Minimal withdrawal to pain left UE and LE.  4/5 in the right upper extremity and 3/5 in right lower extremity     ASSESSMENT/PLAN Seth Potter is a 65 y.o. male with history of diabetes  and hyperlipidemia  presenting with unresponsiveness and elevated blood pressure. He did not receive IV t-PA due to ICH.    ICH: Large acute intraparenchymal hematoma centered near the right basal ganglia.  Resultant  Left hemiplegia  MRI  - not performed  MRA - not performed  CT - Large acute intraparenchymal hematoma measuring 64 x 53 x 69 mm (estimated volume 115 cc).  Carotid Doppler - not indicated  2D Echo - ordered given  malignant hypertension on arrival  LDL - pending   HgbA1c - pending  VTE prophylaxis - SCDs Diet NPO time specified  aspirin 81 mg daily prior to admission, now on No antithrombotic secondary to ICH  Patient counseled to be compliant with his antithrombotic medications  Ongoing aggressive stroke risk factor management  Therapy recommendations:  pending  Disposition:  Pending  Hypertension  Stable  Permissive hypertension (OK if < 220/120) but gradually normalize in 5-7 days  Long-term BP goal normotensive  Hyperlipidemia  Home meds:  Fish oil and cinnamon prior to admission  LDL pending, goal < 70  Continue statin at discharge  Diabetes  HgbA1c pending, goal < 7.0  Uncontrolled  Other Stroke Risk Factors  Advanced age  Obesity, There is no height or weight on file to calculate BMI., recommend weight loss, diet and exercise as appropriate    Other Active Problems  Mild anemia  Mild thrombocytopenia  Mass effect with 14 mm right-to-left shift  CRITICAL CARE NEUROLOGY ATTENDING NOTE Patient was seen and examined by me personally. I reviewed notes, independently viewed imaging studies, participated in medical decision making and plan of care. I have made additions or clarifications directly to the above note. The laboratory and radiographic studies were personally reviewed by me.  ROS pertinent positives could not be fully documented due to LOC  Assessment and plan completed by me personally:   PLAN  Repeat head CT - increased edema  Ordered Mannitol 80 g IV stat  Started hypertonic saline -3% - at 30 mL per hour - peripheral line. Sodium level q 6 hours.  Cardene drip for blood pressure control  Foley catheter  Fever 101.3 Ax. - check CXR, urine culture, blood cultures.  CBC and Bmet (in AM)   Condition is worsened   This patient is critically ill and at significant risk of neurological worsening, death and care requires constant monitoring  of vital signs, hemodynamics,respiratory and cardiac monitoring, extensive review of multiple databases, frequent neurological assessment, discussion with family, other specialists and medical decision making of high complexity.  This critical care time does not reflect procedure time, or teaching time or supervisory time of PA/NP/Med Resident etc. but could involve care discussion time.  I spent 30 minutes of Neurocritical Care time in the care of  this patient.  SIGNED BY: Dr. Sula Sodahere Yeily Link     PLAN  Repeat head CT - reviewed  Mannitol 80 g IV stat  Hypertonic saline -3% - at 30 mL per hour - peripheral line. Sodium level q 6 hours.  Cardene drip for blood pressure control  Blood sugars high will need to address protocols  Foley catheter  Fever 101.3 Ax. - check CXR, urine culture, blood cultures.  CBC and Bmet (in AM)  Hospital day # 1   To contact Stroke Continuity provider, please refer to [email or web address]. After hours, contact General Neurology

## 2016-07-24 NOTE — Progress Notes (Signed)
SLP Cancellation Note  Patient Details Name: Seth Potter MRN: [id number] DOB: 1950-10-09   Cancelled treatment:       Reason Eval/Treat Not Completed: Fatigue/lethargy limiting ability to participate; will f/u next date unless he arouses this afternoon. D/W RN.    Blenda MountsCouture, Jedd Schulenburg Laurice 07/24/2016, 7:49 AM

## 2016-07-24 NOTE — Progress Notes (Signed)
PT Cancellation Note  Patient Details Name: Seth Potter MRN: [id number] DOB: 22-Mar-1951   Cancelled Treatment:    Reason Eval/Treat Not Completed: Medical issues which prohibited therapy;Patient not medically ready.  Patient is on bedrest per orders.  *MD:  Please write activity orders when appropriate for this patient.  PT will initiate evaluation at that time.  Thank you.   Vena AustriaDavis, Seth Laseter H 07/24/2016, 8:19 AM Durenda HurtSusan H. Renaldo Potter, PT, [facility] Acute Rehab Services Pager [phone]

## 2016-07-24 NOTE — Progress Notes (Signed)
Called CT to facilitate his am CT followup. Spoke to Sprint Nextel CorporationKim. To be sent for in 15 min. Offgoing RN confirms that pt was mute overnight as well.

## 2016-07-25 ENCOUNTER — Inpatient Hospital Stay (HOSPITAL_COMMUNITY): Payer: Medicare Other

## 2016-07-25 DIAGNOSIS — I615 Nontraumatic intracerebral hemorrhage, intraventricular: Secondary | ICD-10-CM

## 2016-07-25 DIAGNOSIS — E782 Mixed hyperlipidemia: Secondary | ICD-10-CM

## 2016-07-25 DIAGNOSIS — E1159 Type 2 diabetes mellitus with other circulatory complications: Secondary | ICD-10-CM

## 2016-07-25 DIAGNOSIS — I619 Nontraumatic intracerebral hemorrhage, unspecified: Secondary | ICD-10-CM

## 2016-07-25 LAB — CBC WITH DIFFERENTIAL/PLATELET
BASOS PCT: 0 %
Basophils Absolute: 0 10*3/uL (ref 0.0–0.1)
EOS ABS: 0 10*3/uL (ref 0.0–0.7)
Eosinophils Relative: 0 %
HCT: 36.6 % — ABNORMAL LOW (ref 39.0–52.0)
HEMOGLOBIN: 12.7 g/dL — AB (ref 13.0–17.0)
Lymphocytes Relative: 5 %
Lymphs Abs: 0.7 10*3/uL (ref 0.7–4.0)
MCH: 30.1 pg (ref 26.0–34.0)
MCHC: 34.7 g/dL (ref 30.0–36.0)
MCV: 86.7 fL (ref 78.0–100.0)
MONOS PCT: 5 %
Monocytes Absolute: 0.7 10*3/uL (ref 0.1–1.0)
NEUTROS PCT: 90 %
Neutro Abs: 13.4 10*3/uL — ABNORMAL HIGH (ref 1.7–7.7)
Platelets: 112 10*3/uL — ABNORMAL LOW (ref 150–400)
RBC: 4.22 MIL/uL (ref 4.22–5.81)
RDW: 14.3 % (ref 11.5–15.5)
WBC: 14.9 10*3/uL — AB (ref 4.0–10.5)

## 2016-07-25 LAB — BASIC METABOLIC PANEL
Anion gap: 8 (ref 5–15)
BUN: 16 mg/dL (ref 6–20)
CALCIUM: 9.2 mg/dL (ref 8.9–10.3)
CHLORIDE: 115 mmol/L — AB (ref 101–111)
CO2: 25 mmol/L (ref 22–32)
CREATININE: 0.89 mg/dL (ref 0.61–1.24)
Glucose, Bld: 143 mg/dL — ABNORMAL HIGH (ref 65–99)
Potassium: 3.2 mmol/L — ABNORMAL LOW (ref 3.5–5.1)
SODIUM: 148 mmol/L — AB (ref 135–145)

## 2016-07-25 LAB — GLUCOSE, CAPILLARY
GLUCOSE-CAPILLARY: 147 mg/dL — AB (ref 65–99)
GLUCOSE-CAPILLARY: 145 mg/dL — AB (ref 65–99)
GLUCOSE-CAPILLARY: 162 mg/dL — AB (ref 65–99)
GLUCOSE-CAPILLARY: 157 mg/dL — AB (ref 65–99)
GLUCOSE-CAPILLARY: 117 mg/dL — AB (ref 65–99)
GLUCOSE-CAPILLARY: 263 mg/dL — AB (ref 65–99)
GLUCOSE-CAPILLARY: 302 mg/dL — AB (ref 65–99)
GLUCOSE-CAPILLARY: 308 mg/dL — AB (ref 65–99)
Glucose-Capillary: 132 mg/dL — ABNORMAL HIGH (ref 65–99)
Glucose-Capillary: 141 mg/dL — ABNORMAL HIGH (ref 65–99)
Glucose-Capillary: 149 mg/dL — ABNORMAL HIGH (ref 65–99)
Glucose-Capillary: 199 mg/dL — ABNORMAL HIGH (ref 65–99)

## 2016-07-25 LAB — PHOSPHORUS
PHOSPHORUS: 3 mg/dL (ref 2.5–4.6)
Phosphorus: 3.4 mg/dL (ref 2.5–4.6)

## 2016-07-25 LAB — URINE CULTURE: CULTURE: NO GROWTH

## 2016-07-25 LAB — SODIUM
SODIUM: 149 mmol/L — AB (ref 135–145)
Sodium: 152 mmol/L — ABNORMAL HIGH (ref 135–145)
Sodium: 152 mmol/L — ABNORMAL HIGH (ref 135–145)
Sodium: 153 mmol/L — ABNORMAL HIGH (ref 135–145)

## 2016-07-25 LAB — LIPID PANEL
CHOL/HDL RATIO: 4.5 ratio
CHOLESTEROL: 161 mg/dL (ref 0–200)
HDL: 36 mg/dL — ABNORMAL LOW (ref >40)
LDL Cholesterol: 106 mg/dL — ABNORMAL HIGH (ref 0–99)
Triglycerides: 95 mg/dL (ref <150)
VLDL: 19 mg/dL (ref 0–40)

## 2016-07-25 LAB — MAGNESIUM
MAGNESIUM: 1.8 mg/dL (ref 1.7–2.4)
Magnesium: 1.8 mg/dL (ref 1.7–2.4)

## 2016-07-25 MED ORDER — SODIUM CHLORIDE 3 % IV SOLN
INTRAVENOUS | Status: DC
  Administered 2016-07-25 – 2016-07-26 (×2): 50 mL/h via INTRAVENOUS
  Filled 2016-07-25 (×12): qty 500

## 2016-07-25 MED ORDER — CHLORHEXIDINE GLUCONATE 0.12 % MT SOLN
15.0000 mL | Freq: Two times a day (BID) | OROMUCOSAL | Status: DC
  Administered 2016-07-25 – 2016-07-27 (×5): 15 mL via OROMUCOSAL
  Filled 2016-07-25 (×3): qty 15

## 2016-07-25 MED ORDER — SODIUM CHLORIDE 0.9 % IV SOLN
INTRAVENOUS | Status: DC
  Administered 2016-07-25: 2 [IU]/h via INTRAVENOUS
  Administered 2016-07-27: 9.1 [IU]/h via INTRAVENOUS
  Administered 2016-07-29: 14.1 [IU]/h via INTRAVENOUS
  Filled 2016-07-25 (×3): qty 2.5

## 2016-07-25 MED ORDER — JEVITY 1.2 CAL PO LIQD
1000.0000 mL | ORAL | Status: DC
  Administered 2016-07-25 – 2016-07-29 (×5): 1000 mL
  Filled 2016-07-25 (×8): qty 1000

## 2016-07-25 MED ORDER — VITAL HIGH PROTEIN PO LIQD: 1000.0000 mL | ORAL | Status: DC

## 2016-07-25 MED ORDER — ORAL CARE MOUTH RINSE
15.0000 mL | Freq: Two times a day (BID) | OROMUCOSAL | Status: DC
  Administered 2016-07-26 – 2016-07-27 (×4): 15 mL via OROMUCOSAL

## 2016-07-25 MED ORDER — PRO-STAT SUGAR FREE PO LIQD
30.0000 mL | Freq: Two times a day (BID) | ORAL | Status: DC
  Administered 2016-07-25 – 2016-08-08 (×28): 30 mL
  Filled 2016-07-25 (×28): qty 30

## 2016-07-25 MED ORDER — CLEVIDIPINE BUTYRATE 0.5 MG/ML IV EMUL
0.0000 mg/h | INTRAVENOUS | Status: DC
Start: 2016-07-25 — End: 2016-07-29
  Administered 2016-07-25: 1 mg/h via INTRAVENOUS
  Administered 2016-07-25: 5 mg/h via INTRAVENOUS
  Administered 2016-07-26 (×2): 7 mg/h via INTRAVENOUS
  Administered 2016-07-26 (×2): 8 mg/h via INTRAVENOUS
  Administered 2016-07-26: 9 mg/h via INTRAVENOUS
  Administered 2016-07-26 (×2): 8 mg/h via INTRAVENOUS
  Administered 2016-07-27: 1 mg/h via INTRAVENOUS
  Administered 2016-07-27: 3 mg/h via INTRAVENOUS
  Filled 2016-07-25 (×13): qty 50

## 2016-07-25 MED ORDER — LISINOPRIL 20 MG PO TABS
20.0000 mg | ORAL_TABLET | Freq: Two times a day (BID) | ORAL | Status: DC
  Administered 2016-07-25 – 2016-07-27 (×5): 20 mg via ORAL
  Filled 2016-07-25 (×5): qty 1

## 2016-07-25 MED ORDER — INSULIN ASPART 100 UNIT/ML ~~LOC~~ SOLN
2.0000 [IU] | SUBCUTANEOUS | Status: DC
Start: 2016-07-25 — End: 2016-07-25
  Administered 2016-07-25 (×3): 4 [IU] via SUBCUTANEOUS

## 2016-07-25 MED ORDER — SODIUM CHLORIDE 23.4 % INJECTION (4 MEQ/ML) FOR IV ADMINISTRATION
30.0000 mL | Freq: Once | INTRAVENOUS | Status: AC
  Administered 2016-07-25: 30 mL via INTRAVENOUS
  Filled 2016-07-25: qty 30

## 2016-07-25 MED ORDER — AMLODIPINE BESYLATE 5 MG PO TABS
5.0000 mg | ORAL_TABLET | Freq: Every day | ORAL | Status: DC
  Administered 2016-07-25 – 2016-07-26 (×2): 5 mg via ORAL
  Filled 2016-07-25 (×2): qty 1

## 2016-07-25 MED ORDER — INSULIN GLARGINE 100 UNIT/ML ~~LOC~~ SOLN
10.0000 [IU] | Freq: Every day | SUBCUTANEOUS | Status: DC
  Administered 2016-07-25: 10 [IU] via SUBCUTANEOUS
  Filled 2016-07-25 (×2): qty 0.1

## 2016-07-25 NOTE — Progress Notes (Signed)
Initial Nutrition Assessment  INTERVENTION:   Jevity 1.2 @ 65 ml/hr (1560 ml/day) 30 ml Prostat BID Provides: 2072 kcal, 116 grams protein, and 1265 ml H2O.    NUTRITION DIAGNOSIS:   Inadequate oral intake related to inability to eat as evidenced by NPO status.  GOAL:   Patient will meet greater than or equal to 90% of their needs  MONITOR:   TF tolerance, I & O's, Vent status  REASON FOR ASSESSMENT:   Consult Enteral/tube feeding initiation and management  ASSESSMENT:   Pt with hx of HTN and MD who was admitted with unresponsiveness. CT shows large ICH with 14 mm shift.   Pt discussed during ICU rounds and with RN.  Spoke with family, they report good appetite PTA. Pt's wife thought he did weigh 185 lb but noticed it was lower. They report pt's knee was giving him trouble but he was still very active.   Medications reviewed and include: lantus, senokot-s, 23% saline, cleviprex Labs reviewed: Na 149 (23% not yet started), K+ 3.2 CBG's: 162-157-117 Cleviprex infusing @ 10 ml/hr which would provide 480 kcal in 24 hours Nutrition-Focused physical exam completed. Findings are no fat depletion, mild/moderate bilateral lower extremity muscle depletion, and no edema.    Diet Order:  Diet NPO time specified  Skin:  Reviewed, no issues  Last BM:  11/3  Height:   Ht Readings from Last 1 Encounters:  07/25/16 6' (1.829 m)    Weight:   Wt Readings from Last 1 Encounters:  07/21/2016 179 lb 10.8 oz (81.5 kg)    Ideal Body Weight:  80.9 kg  BMI:  There is no height or weight on file to calculate BMI.  Estimated Nutritional Needs:   Kcal:  2050-2250  Protein:  105-115 grams  Fluid:  > 2 L/day  EDUCATION NEEDS:   No education needs identified at this time  Kendell BaneHeather Charmine Bockrath RD, LDN, CNSC [phone] Pager [phone] After Hours Pager

## 2016-07-25 NOTE — Progress Notes (Signed)
SLP Cancellation Note  Patient Details Name: Seth Potter MRN: [id number] DOB: 1950/09/23   Cancelled treatment:        Too lethargic this am for swallow assessment. Will follow along.   Seth Potter, Seth Potter 07/25/2016, 8:14 AM  Seth Potter M.Ed [employer]-SLP Pager [phone]

## 2016-07-25 NOTE — Procedures (Signed)
Central Venous Catheter Insertion Procedure Note Seth PerfectRichard M Potter [id number] Jun 04, 1951  Procedure: Insertion of Central Venous Catheter Indications: Assessment of intravascular volume, Drug and/or fluid administration, Frequent blood sampling and 24% nacl  Procedure Details Consent: Risks of procedure as well as the alternatives and risks of each were explained to the (patient/caregiver).  Consent for procedure obtained. Time Out: Verified patient identification, verified procedure, site/side was marked, verified correct patient position, special equipment/implants available, medications/allergies/relevent history reviewed, required imaging and test results available.  Performed  Maximum sterile technique was used including antiseptics, cap, gloves, gown, hand hygiene, mask and sheet. Skin prep: Chlorhexidine; local anesthetic administered A antimicrobial bonded/coated triple lumen catheter was placed in the left internal jugular vein using the Seldinger technique.  Evaluation Blood flow good Complications: No apparent complications Patient did tolerate procedure well. Chest X-ray ordered to verify placement.  CXR: pending.  [name],[name] 07/25/2016, 2:37 PM  US  [name], MD, FACP Pgr: [phone] [facility]\

## 2016-07-25 NOTE — Progress Notes (Signed)
OT Cancellation Note  Patient Details Name: Ardeth PerfectRichard M Lemoine MRN: [id number] DOB: 09/27/1950   Cancelled Treatment:    Reason Eval/Treat Not Completed: Medical issues which prohibited therapy.  Pt currently on bedrest.  Will check back and initiate OT eval once activity level is increased.  Dylann Gallier Dublinonarpe, OTR/L [phone]   Jeani HawkingConarpe, Tiamarie Furnari M 07/25/2016, 10:34 AM

## 2016-07-25 NOTE — Progress Notes (Signed)
PT Cancellation Note  Patient Details Name: Seth Potter MRN: [id number] DOB: 1950-10-14   Cancelled Treatment:    Reason Eval/Treat Not Completed: Patient not medically ready Pt still on bedrest. Will await increase in activity orders prior to initiation of pT evaluation.   Blake DivineShauna A Anayeli Arel 07/25/2016, 8:54 AM  Mylo RedShauna Lameshia Hypolite, PT, DPT [phone]

## 2016-07-25 NOTE — Significant Event (Signed)
Follow up consult for cortrak feeding tube placement as the backup person. Spoke with patient's RN who gave consent to proceed with small bored feeding tube placement.   RN placed via left nare-marked at 100cm. Patient tolerated procedure. KUB ordered. Once tip of cortrak tube is confirmed by KUB, staff may use it. Please save tube if it becomes dislodge and in need of replacement. Thank you.

## 2016-07-25 NOTE — Progress Notes (Signed)
STROKE TEAM PROGRESS NOTE   SUBJECTIVE (INTERVAL HISTORY) His RN is at the bedside. Met wife later during rounds. Patient can not open eyes on command, but able to follow peripheral commands on the right hand and wiggle toes on the right foot. Tmax 100.6. BP stable at goal. Did not pass swallow and need tube feeding.   Wife stated that pt would not want trach or PEG or nursing home placement. I updated her about the critical condition and large right ICH and his ICH score is 3 so far. She understood the critical condition, and she would like to discuss with her children.  OBJECTIVE Temp:  [97.5 F (36.4 C)-100.6 F (38.1 C)] 100.6 F (38.1 C) (11/06 0800) Pulse Rate:  [57-104] 66 (11/06 0900) Cardiac Rhythm: Normal sinus rhythm (11/05 2300) Resp:  [16-46] 19 (11/06 0900) BP: (129-202)/(49-92) 150/58 (11/06 0900) SpO2:  [92 %-99 %] 95 % (11/06 0900)  CBC:   Recent Labs Lab 07/25/2016 1740 07/30/2016 1743 07/25/16 0228  WBC 7.7  --  14.9*  NEUTROABS 6.1  --  13.4*  HGB 13.2 11.9* 12.7*  HCT 37.5* 35.0* 36.6*  MCV 84.5  --  86.7  PLT 92*  --  112*    Basic Metabolic Panel:   Recent Labs Lab 08/16/2016 1740 07/25/2016 1743  07/25/16 0228 07/25/16 0738  NA 136 140  < > 148* 149*  K 3.5 3.5  --  3.2*  --   CL 105 107  --  115*  --   CO2 19*  --   --  25  --   GLUCOSE 360* 364*  --  143*  --   BUN 14 17  --  16  --   CREATININE 0.86 0.80  --  0.89  --   CALCIUM 9.0  --   --  9.2  --   < > = values in this interval not displayed.  Lipid Panel:     Component Value Date/Time   CHOL 161 07/25/2016 0228   TRIG 95 07/25/2016 0228   HDL 36 (L) 07/25/2016 0228   CHOLHDL 4.5 07/25/2016 0228   VLDL 19 07/25/2016 0228   LDLCALC 106 (H) 07/25/2016 0228   HgbA1c: No results found for: HGBA1C Urine Drug Screen:     Component Value Date/Time   LABOPIA NONE DETECTED 07/30/2016 0918   COCAINSCRNUR NONE DETECTED 08/06/2016 0918   LABBENZ NONE DETECTED 08/08/2016 0918   AMPHETMU  NONE DETECTED 07/27/2016 0918   THCU NONE DETECTED 08/17/2016 0918   LABBARB NONE DETECTED 08/08/2016 0918      IMAGING I have personally reviewed the radiological images below and agree with the radiology interpretations.  Ct Head Code Stroke W/o Cm 08/06/2016 1. Large acute intraparenchymal hematoma measuring 64 x 53 x 69 mm (estimated volume 115 cc) centered near the right basal ganglia. Localized mass effect with up to 14 mm of right-to-left shift.  2. Associated intraventricular extension with blood in the lateral and third ventricles. Dilatation of the temporal horns of both lateral ventricles without hydrocephalus.  3. Probable subdural extension with tiny right subdural hematoma measuring up to 3 mm.  4. Underlying chronic microvascular ischemic disease.   CT Head WO Contrast - 07/24/16 1. Unchanged size of large right basal ganglia region parenchymal hemorrhage. Slightly increased edema without increased midline shift. 2. Increased intraventricular hemorrhage with mild interval left lateral ventricular dilatation concerning for developing Hydrocephalus.  Dg Chest Port 1 View 07/25/16 1. Tip of the left IJ catheter  is in the projection of the SVC. 2. New right lung opacity, which may reflect asymmetric edema or pneumonia.   TEE pending   PHYSICAL EXAM  Temp:  [97.5 F (36.4 C)-100.6 F (38.1 C)] 100.6 F (38.1 C) (11/06 0800) Pulse Rate:  [57-104] 66 (11/06 0900) Resp:  [16-46] 19 (11/06 0900) BP: (129-202)/(49-92) 150/58 (11/06 0900) SpO2:  [92 %-99 %] 95 % (11/06 0900)  General - Well nourished, well developed, drowsy.  Ophthalmologic - Fundi not visualized due to noncooperation.  Cardiovascular - Regular rate and rhythm.  Neuro - drowsy and sleepy, eyes closed, stop responding without constant stimulation. However, pt able to follow peripheral commands on the left hand and wiggle left toes. PERRL, not blink to visual threat bilaterally. Left facial mild droopy,  left UE flaccid, left LE trace withdraw on pain stimulation. RUE at least 4/5 and follows command, RLE at least 3-/5 on pain stimulation. DTR 1+ and no babinski. Sensation, coordination and gait not tested.    ASSESSMENT/PLAN [name] is a 65 y.o. 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 He did not receive IV t-PA due to [place].   ICH: Large acute right intraparenchymal hematoma centered near the right basal ganglia, likely due to long-standing HTN and DM.  Resultant  Left hemiplegia, drowsiness  CT - Large acute intraparenchymal hematoma about 1 15 mL.  Repeat CT stable hematoma but increased edema and developing hydrocephalus  Carotid Doppler - not ordered  2D Echo - pending  LDL - 106  HgbA1c - pending  VTE prophylaxis - SCDs Diet NPO time specified  aspirin 81 mg daily prior to admission, now on No antithrombotic secondary to [place].  Patient counseled to be compliant with his antithrombotic medications  Ongoing aggressive stroke risk factor management  Therapy recommendations:  pending  Disposition:  Pending  Cerebral edema  Large ICH with significant midline shift  On 3% saline  Na 150-160  23.4% saline once   Family would not consider surgery as it would not alter functional outcome  Hypertension  Stable  BP goal < 160  Switch cardene to cleviprex  Add lisinopril and amlodipine for BP control  Wean off cleviprex as able  Hyperlipidemia  Home meds:  Fish oil and cinnamon prior to admission  LDL 106 goal < 70  Hold off statin at this time due to large ICH  Diabetes  HgbA1c pending, goal < 7.0  Uncontrolled  Off insulin drip  On Lantus  SSI  Dysphagia   Did not pass swallow  Need TF for nutrition  Speech on board  Other Stroke Risk Factors  Advanced age  Hx of ICH 15 years ago without residue  Other  Active Problems  Mild anemia  Mild thrombocytopenia  Fever 101.3 Ax. - Blood culture NGTD  Hospital day # 2  This patient is critically ill due to large right hemisphere ICH, hypertension, uncontrolled diabetes and at significant risk of neurological worsening, death form hematoma expansion, cerebral edema, brain herniation, DKA, hypertensive emergency. This patient's care requires constant monitoring of vital signs, hemodynamics, respiratory and cardiac monitoring, review of multiple databases, neurological assessment, discussion with family, other specialists and medical decision making of high complexity. I had long discussion with wife at bedside updating her about patient current condition, treatment options, and likely poor prognosis. Wife and the student critical condition and would like to discuss with her children.  I spent 60 minutes of neurocritical care time in the care of this patient.  Rosalin Hawking, MD PhD Stroke Neurology 07/25/2016 5:07 PM

## 2016-07-26 ENCOUNTER — Inpatient Hospital Stay (HOSPITAL_COMMUNITY): Payer: Medicare Other

## 2016-07-26 DIAGNOSIS — I6789 Other cerebrovascular disease: Secondary | ICD-10-CM

## 2016-07-26 LAB — CBC
HEMATOCRIT: 37.2 % — AB (ref 39.0–52.0)
HEMOGLOBIN: 12.4 g/dL — AB (ref 13.0–17.0)
MCH: 29.9 pg (ref 26.0–34.0)
MCHC: 33.3 g/dL (ref 30.0–36.0)
MCV: 89.6 fL (ref 78.0–100.0)
Platelets: 107 10*3/uL — ABNORMAL LOW (ref 150–400)
RBC: 4.15 MIL/uL — ABNORMAL LOW (ref 4.22–5.81)
RDW: 14.5 % (ref 11.5–15.5)
WBC: 9.8 10*3/uL (ref 4.0–10.5)

## 2016-07-26 LAB — GLUCOSE, CAPILLARY
GLUCOSE-CAPILLARY: 134 mg/dL — AB (ref 65–99)
GLUCOSE-CAPILLARY: 149 mg/dL — AB (ref 65–99)
GLUCOSE-CAPILLARY: 164 mg/dL — AB (ref 65–99)
GLUCOSE-CAPILLARY: 155 mg/dL — AB (ref 65–99)
GLUCOSE-CAPILLARY: 141 mg/dL — AB (ref 65–99)
GLUCOSE-CAPILLARY: 141 mg/dL — AB (ref 65–99)
GLUCOSE-CAPILLARY: 134 mg/dL — AB (ref 65–99)
GLUCOSE-CAPILLARY: 137 mg/dL — AB (ref 65–99)
GLUCOSE-CAPILLARY: 162 mg/dL — AB (ref 65–99)
GLUCOSE-CAPILLARY: 191 mg/dL — AB (ref 65–99)
Glucose-Capillary: 143 mg/dL — ABNORMAL HIGH (ref 65–99)
Glucose-Capillary: 149 mg/dL — ABNORMAL HIGH (ref 65–99)
Glucose-Capillary: 152 mg/dL — ABNORMAL HIGH (ref 65–99)
Glucose-Capillary: 165 mg/dL — ABNORMAL HIGH (ref 65–99)
Glucose-Capillary: 174 mg/dL — ABNORMAL HIGH (ref 65–99)
Glucose-Capillary: 176 mg/dL — ABNORMAL HIGH (ref 65–99)
Glucose-Capillary: 181 mg/dL — ABNORMAL HIGH (ref 65–99)
Glucose-Capillary: 190 mg/dL — ABNORMAL HIGH (ref 65–99)
Glucose-Capillary: 195 mg/dL — ABNORMAL HIGH (ref 65–99)
Glucose-Capillary: 214 mg/dL — ABNORMAL HIGH (ref 65–99)
Glucose-Capillary: 218 mg/dL — ABNORMAL HIGH (ref 65–99)
Glucose-Capillary: 246 mg/dL — ABNORMAL HIGH (ref 65–99)
Glucose-Capillary: 269 mg/dL — ABNORMAL HIGH (ref 65–99)

## 2016-07-26 LAB — SODIUM
SODIUM: 160 mmol/L — AB (ref 135–145)
Sodium: 159 mmol/L — ABNORMAL HIGH (ref 135–145)
Sodium: 158 mmol/L — ABNORMAL HIGH (ref 135–145)

## 2016-07-26 LAB — BASIC METABOLIC PANEL
Anion gap: 9 (ref 5–15)
BUN: 29 mg/dL — ABNORMAL HIGH (ref 6–20)
CHLORIDE: 125 mmol/L — AB (ref 101–111)
CO2: 26 mmol/L (ref 22–32)
CREATININE: 0.96 mg/dL (ref 0.61–1.24)
Calcium: 9 mg/dL (ref 8.9–10.3)
GFR calc non Af Amer: >60 mL/min (ref >60)
GLUCOSE: 160 mg/dL — AB (ref 65–99)
Potassium: 3.3 mmol/L — ABNORMAL LOW (ref 3.5–5.1)
Sodium: 160 mmol/L — ABNORMAL HIGH (ref 135–145)

## 2016-07-26 LAB — MAGNESIUM
Magnesium: 2 mg/dL (ref 1.7–2.4)
Magnesium: 2.2 mg/dL (ref 1.7–2.4)

## 2016-07-26 LAB — ECHOCARDIOGRAM COMPLETE
HEIGHTINCHES: 72 in
WEIGHTICAEL: 2673.74 [oz_av]

## 2016-07-26 LAB — PHOSPHORUS
PHOSPHORUS: 1.9 mg/dL — AB (ref 2.5–4.6)
Phosphorus: 2.6 mg/dL (ref 2.5–4.6)

## 2016-07-26 LAB — HEMOGLOBIN A1C
HEMOGLOBIN A1C: 8.2 % — AB (ref 4.8–5.6)
MEAN PLASMA GLUCOSE: 189 mg/dL

## 2016-07-26 MED ORDER — ACETAMINOPHEN 160 MG/5ML PO SOLN
650.0000 mg | ORAL | Status: DC | PRN
  Administered 2016-07-26 – 2016-08-05 (×20): 650 mg
  Filled 2016-07-26 (×20): qty 20.3

## 2016-07-26 MED ORDER — POTASSIUM CHLORIDE 20 MEQ/15ML (10%) PO SOLN
40.0000 meq | ORAL | Status: AC
  Administered 2016-07-26 (×2): 40 meq via ORAL
  Filled 2016-07-26 (×2): qty 30

## 2016-07-26 MED ORDER — POTASSIUM CHLORIDE CRYS ER 20 MEQ PO TBCR: 40.0000 meq | EXTENDED_RELEASE_TABLET | ORAL | Status: DC

## 2016-07-26 MED ORDER — LABETALOL HCL 100 MG PO TABS
200.0000 mg | ORAL_TABLET | Freq: Three times a day (TID) | ORAL | Status: DC
  Administered 2016-07-26 – 2016-07-27 (×5): 200 mg via ORAL
  Filled 2016-07-26 (×5): qty 2

## 2016-07-26 MED ORDER — AMLODIPINE BESYLATE 10 MG PO TABS
10.0000 mg | ORAL_TABLET | Freq: Every day | ORAL | Status: DC
Start: 2016-07-27 — End: 2016-08-08
  Administered 2016-07-27 – 2016-08-08 (×13): 10 mg via ORAL
  Filled 2016-07-26 (×13): qty 1

## 2016-07-26 NOTE — Progress Notes (Signed)
PT Cancellation Note  Patient Details Name: Ardeth PerfectRichard M Vanbuskirk MRN: [id number] DOB: 04-09-1951   Cancelled Treatment:    Reason Eval/Treat Not Completed: Patient not medically ready Pt continues to be on bedrest, 3% saline. Will await increase in activity orders prior to performing PT evaluation.     Blake DivineShauna A Vendetta Pittinger 07/26/2016, 10:29 AM Mylo RedShauna Damario Gillie, PT, DPT [phone]

## 2016-07-26 NOTE — Progress Notes (Signed)
  Echocardiogram 2D Echocardiogram has been performed.  Arvil ChacoFoster, Attilio Zeitler 07/26/2016, 3:01 PM

## 2016-07-26 NOTE — Progress Notes (Signed)
SLP Cancellation Note  Patient Details Name: Seth PerfectRichard M Scharrer MRN: [id number] DOB: 1951-06-13   Cancelled treatment:        RN reports pt continues to be lethargic (even more than yesterday). He is not yet appropriate for speech-lang-cognitive eval (swallow orders were cancelled). Will follow along.   Royce MacadamiaLitaker, Marva Hendryx Willis 07/26/2016, 11:14 AM  Breck CoonsLisa Willis Lonell FaceLitaker M.Ed [employer] Pager [phone]

## 2016-07-26 NOTE — Progress Notes (Signed)
OT Cancellation Note  Patient Details Name: Ardeth PerfectRichard M Kluever MRN: [id number] DOB: [birth year]   Cancelled Treatment:    Reason Eval/Treat Not Completed: Patient not medically ready  Boone MasterJones, Naomee Nowland B 07/26/2016, 3:35 PM

## 2016-07-26 NOTE — Progress Notes (Signed)
STROKE TEAM PROGRESS NOTE   SUBJECTIVE (INTERVAL HISTORY) His RN is at the bedside. Pt did not following commands this am. He still moves right UE and LE, not as purposeful as yesterday. Still on cleviprex, and insulin drip. Na 160 and 3% saline on hold.   OBJECTIVE Temp:  [98.7 F (37.1 C)-100.6 F (38.1 C)] 98.9 F (37.2 C) (11/07 1600) Pulse Rate:  [68-105] 69 (11/07 1500) Cardiac Rhythm: Normal sinus rhythm (11/07 1400) Resp:  [16-30] 19 (11/07 1500) BP: (125-164)/(54-72) 141/62 (11/07 1500) SpO2:  [92 %-100 %] 96 % (11/07 1500) Weight:  [167 lb 1.7 oz (75.8 kg)] 167 lb 1.7 oz (75.8 kg) (11/07 0500)  CBC:   Recent Labs Lab 08/03/2016 1740  07/25/16 0228 07/26/16 0510  WBC 7.7  --  14.9* 9.8  NEUTROABS 6.1  --  13.4*  --   HGB 13.2  < > 12.7* 12.4*  HCT 37.5*  < > 36.6* 37.2*  MCV 84.5  --  86.7 89.6  PLT 92*  --  112* 107*  < > = values in this interval not displayed.  Basic Metabolic Panel:   Recent Labs Lab 07/25/16 0228  07/25/16 1545  07/26/16 0510 07/26/16 0944  NA 148*  < > 152*  < > 160* 158*  K 3.2*  --   --   --  3.3*  --   CL 115*  --   --   --  125*  --   CO2 25  --   --   --  26  --   GLUCOSE 143*  --   --   --  160*  --   BUN 16  --   --   --  29*  --   CREATININE 0.89  --   --   --  0.96  --   CALCIUM 9.2  --   --   --  9.0  --   MG  --   < > 1.8  --  2.0  --   PHOS  --   < > 3.4  --  1.9*  --   < > = values in this interval not displayed.  Lipid Panel:     Component Value Date/Time   CHOL 161 07/25/2016 0228   TRIG 95 07/25/2016 0228   HDL 36 (L) 07/25/2016 0228   CHOLHDL 4.5 07/25/2016 0228   VLDL 19 07/25/2016 0228   LDLCALC 106 (H) 07/25/2016 0228   HgbA1c:  Lab Results  Component Value Date   HGBA1C 8.2 (H) 07/25/2016   Urine Drug Screen:     Component Value Date/Time   LABOPIA NONE DETECTED 08/10/2016 0918   COCAINSCRNUR NONE DETECTED 07/31/2016 0918   LABBENZ NONE DETECTED 08/16/2016 0918   AMPHETMU NONE DETECTED  07/28/2016 0918   THCU NONE DETECTED 08/17/2016 0918   LABBARB NONE DETECTED 08/01/2016 0918      IMAGING I have personally reviewed the radiological images below and agree with the radiology interpretations.  Ct Head Code Stroke W/o Cm 07/25/2016 1. Large acute intraparenchymal hematoma measuring 64 x 53 x 69 mm (estimated volume 115 cc) centered near the right basal ganglia. Localized mass effect with up to 14 mm of right-to-left shift.  2. Associated intraventricular extension with blood in the lateral and third ventricles. Dilatation of the temporal horns of both lateral ventricles without hydrocephalus.  3. Probable subdural extension with tiny right subdural hematoma measuring up to 3 mm.  4. Underlying chronic microvascular ischemic disease.  CT Head WO Contrast - 07/24/16 1. Unchanged size of large right basal ganglia region parenchymal hemorrhage. Slightly increased edema without increased midline shift. 2. Increased intraventricular hemorrhage with mild interval left lateral ventricular dilatation concerning for developing Hydrocephalus.  Dg Chest Port 1 View 07/25/16 1. Tip of the left IJ catheter is in the projection of the SVC. 2. New right lung opacity, which may reflect asymmetric edema or pneumonia.   TEE Left ventricle: The cavity size was normal. Wall thickness was   increased in a pattern of mild LVH. Systolic function was   vigorous. The estimated ejection fraction was in the range of 65%   to 70%. Wall motion was normal; there were no regional wall   motion abnormalities. Doppler parameters are consistent with   abnormal left ventricular relaxation (grade 1 diastolic   dysfunction). - Aortic valve: Trileaflet; mildly thickened leaflets. - Left atrium: The atrium was mildly dilated. Impressions: - No cardiac source of emboli was indentified.  CT head pending   PHYSICAL EXAM  Temp:  [98.7 F (37.1 C)-100.6 F (38.1 C)] 98.9 F (37.2 C) (11/07  1600) Pulse Rate:  [68-105] 69 (11/07 1500) Resp:  [16-30] 19 (11/07 1500) BP: (125-164)/(54-72) 141/62 (11/07 1500) SpO2:  [92 %-100 %] 96 % (11/07 1500) Weight:  [167 lb 1.7 oz (75.8 kg)] 167 lb 1.7 oz (75.8 kg) (11/07 0500)  General - Well nourished, well developed, obtunded.  Ophthalmologic - Fundi not visualized due to noncooperation.  Cardiovascular - Regular rate and rhythm.  Neuro - obtunded, eyes closed but able to barely open with strong stimulation. However, pt not follow commands on the right hand as yesterday. PERRL, not blink to visual threat bilaterally. Left facial mild droopy, left UE flaccid, left LE mild withdraw on pain stimulation. RUE at least 4/5, RLE at least 3-/5 on pain stimulation. DTR 1+ and no babinski. Sensation, coordination and gait not tested.    ASSESSMENT/PLAN Seth Potter is a 65 y.o. 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 He did not receive IV t-PA due to ICH.   ICH: Large acute right intraparenchymal hematoma centered near the right basal ganglia, likely due to long-standing HTN and DM.  Resultant  Left hemiplegia, drowsiness  CT - Large acute intraparenchymal hematoma about 115 mL.  Repeat CT stable hematoma but increased edema and developing hydrocephalus  CT repeat in am  Carotid Doppler - not ordered  2D Echo - EF 65-70%  LDL - 106  HgbA1c - 8.2  VTE prophylaxis - SCDs Diet NPO time specified  aspirin 81 mg daily prior to admission, now on No antithrombotic secondary to ICH.  Patient counseled to be compliant with his antithrombotic medications  Ongoing aggressive stroke risk factor management  Therapy recommendations:  pending  Disposition:  Pending  Cerebral edema  Large ICH with significant midline shift  CT repeat in am  On 3% saline  Na 150-160  Na 158->160->158  Family would not  consider surgery as it would not alter functional outcome  Hypertension  Stable  BP goal < 160  Still on cleviprex  Add lisinopril and amlodipine for BP control  Add labetalol for BP control  Wean off cleviprex as able  Hyperlipidemia  Home meds:  Fish oil and cinnamon prior to admission  LDL 106 goal < 70  Hold off statin at this time due to large ICH  Diabetes  HgbA1c 8.2, goal < 7.0  Uncontrolled  On insulin drip  SSI  Dysphagia   Did not pass swallow  on TF for nutrition  Other Stroke Risk Factors  Advanced age  Hx of ICH 15 years ago without residue  Other Active Problems  Mild thrombocytopenia 112->107  Fever 101.3 Ax. - Blood culture NGTD  Hypokalemia - supplement  Hospital day # 3  This patient is critically ill due to large right hemisphere ICH, hypertension, uncontrolled diabetes and at significant risk of neurological worsening, death form hematoma expansion, cerebral edema, brain herniation, DKA, hypertensive emergency. This patient's care requires constant monitoring of vital signs, hemodynamics, respiratory and cardiac monitoring, review of multiple databases, neurological assessment, discussion with family, other specialists and medical decision making of high complexity. I spent 40 minutes of neurocritical care time in the care of this patient.  Marvel PlanJindong Ramiya Delahunty, MD PhD Stroke Neurology 07/26/2016 4:57 PM

## 2016-07-27 ENCOUNTER — Inpatient Hospital Stay (HOSPITAL_COMMUNITY): Payer: Medicare Other

## 2016-07-27 DIAGNOSIS — J9601 Acute respiratory failure with hypoxia: Secondary | ICD-10-CM

## 2016-07-27 DIAGNOSIS — G9340 Encephalopathy, unspecified: Secondary | ICD-10-CM

## 2016-07-27 DIAGNOSIS — I629 Nontraumatic intracranial hemorrhage, unspecified: Secondary | ICD-10-CM

## 2016-07-27 DIAGNOSIS — I10 Essential (primary) hypertension: Secondary | ICD-10-CM

## 2016-07-27 DIAGNOSIS — R509 Fever, unspecified: Secondary | ICD-10-CM

## 2016-07-27 LAB — GLUCOSE, CAPILLARY
GLUCOSE-CAPILLARY: 128 mg/dL — AB (ref 65–99)
GLUCOSE-CAPILLARY: 140 mg/dL — AB (ref 65–99)
GLUCOSE-CAPILLARY: 214 mg/dL — AB (ref 65–99)
GLUCOSE-CAPILLARY: 171 mg/dL — AB (ref 65–99)
GLUCOSE-CAPILLARY: 167 mg/dL — AB (ref 65–99)
GLUCOSE-CAPILLARY: 126 mg/dL — AB (ref 65–99)
GLUCOSE-CAPILLARY: 127 mg/dL — AB (ref 65–99)
GLUCOSE-CAPILLARY: 201 mg/dL — AB (ref 65–99)
GLUCOSE-CAPILLARY: 211 mg/dL — AB (ref 65–99)
GLUCOSE-CAPILLARY: 177 mg/dL — AB (ref 65–99)
Glucose-Capillary: 131 mg/dL — ABNORMAL HIGH (ref 65–99)
Glucose-Capillary: 131 mg/dL — ABNORMAL HIGH (ref 65–99)
Glucose-Capillary: 163 mg/dL — ABNORMAL HIGH (ref 65–99)
Glucose-Capillary: 188 mg/dL — ABNORMAL HIGH (ref 65–99)
Glucose-Capillary: 193 mg/dL — ABNORMAL HIGH (ref 65–99)
Glucose-Capillary: 194 mg/dL — ABNORMAL HIGH (ref 65–99)
Glucose-Capillary: 203 mg/dL — ABNORMAL HIGH (ref 65–99)
Glucose-Capillary: 206 mg/dL — ABNORMAL HIGH (ref 65–99)
Glucose-Capillary: 207 mg/dL — ABNORMAL HIGH (ref 65–99)

## 2016-07-27 LAB — BLOOD GAS, ARTERIAL
Acid-Base Excess: 0.2 mmol/L (ref 0.0–2.0)
Acid-Base Excess: 2.9 mmol/L — ABNORMAL HIGH (ref 0.0–2.0)
BICARBONATE: 22.7 mmol/L (ref 20.0–28.0)
Bicarbonate: 25.8 mmol/L (ref 20.0–28.0)
Drawn by: 44166
Drawn by: 244851
FIO2: 100
O2 SAT: 93.5 %
O2 SAT: 99.3 %
PCO2 ART: 27.5 mmHg — AB (ref 32.0–48.0)
PEEP: 5 cmH2O
PO2 ART: 65 mmHg — AB (ref 83.0–108.0)
Patient temperature: 98.6
Patient temperature: 100.3
RATE: 14 resp/min
VT: 620 mL
pCO2 arterial: 33.7 mmHg (ref 32.0–48.0)
pH, Arterial: 7.527 — ABNORMAL HIGH (ref 7.350–7.450)
pH, Arterial: 7.5 — ABNORMAL HIGH (ref 7.350–7.450)
pO2, Arterial: 134 mmHg — ABNORMAL HIGH (ref 83.0–108.0)

## 2016-07-27 LAB — BASIC METABOLIC PANEL
ANION GAP: 8 (ref 5–15)
BUN: 34 mg/dL — ABNORMAL HIGH (ref 6–20)
CHLORIDE: 127 mmol/L — AB (ref 101–111)
CO2: 25 mmol/L (ref 22–32)
Calcium: 9 mg/dL (ref 8.9–10.3)
Creatinine, Ser: 1 mg/dL (ref 0.61–1.24)
GFR calc non Af Amer: >60 mL/min (ref >60)
Glucose, Bld: 162 mg/dL — ABNORMAL HIGH (ref 65–99)
POTASSIUM: 3.3 mmol/L — AB (ref 3.5–5.1)
SODIUM: 160 mmol/L — AB (ref 135–145)

## 2016-07-27 LAB — CBC
HEMATOCRIT: 37.4 % — AB (ref 39.0–52.0)
HEMOGLOBIN: 12.2 g/dL — AB (ref 13.0–17.0)
MCH: 29.4 pg (ref 26.0–34.0)
MCHC: 32.6 g/dL (ref 30.0–36.0)
MCV: 90.1 fL (ref 78.0–100.0)
Platelets: 100 10*3/uL — ABNORMAL LOW (ref 150–400)
RBC: 4.15 MIL/uL — AB (ref 4.22–5.81)
RDW: 14 % (ref 11.5–15.5)
WBC: 9 10*3/uL (ref 4.0–10.5)

## 2016-07-27 LAB — SODIUM
SODIUM: 159 mmol/L — AB (ref 135–145)
Sodium: 162 mmol/L (ref 135–145)
Sodium: 158 mmol/L — ABNORMAL HIGH (ref 135–145)

## 2016-07-27 LAB — TROPONIN I: TROPONIN I: 0.11 ng/mL — AB (ref <0.03)

## 2016-07-27 MED ORDER — CHLORHEXIDINE GLUCONATE 0.12% ORAL RINSE (MEDLINE KIT)
15.0000 mL | Freq: Two times a day (BID) | OROMUCOSAL | Status: DC
  Administered 2016-07-28 – 2016-08-08 (×24): 15 mL via OROMUCOSAL

## 2016-07-27 MED ORDER — ROCURONIUM BROMIDE 50 MG/5ML IV SOLN
50.0000 mg | Freq: Once | INTRAVENOUS | Status: AC
  Administered 2016-07-27: 50 mg via INTRAVENOUS
  Filled 2016-07-27: qty 5

## 2016-07-27 MED ORDER — FREE WATER
200.0000 mL | Freq: Three times a day (TID) | Status: AC
  Administered 2016-07-27 (×2): 200 mL

## 2016-07-27 MED ORDER — ALTEPLASE 2 MG IJ SOLR
2.0000 mg | Freq: Once | INTRAMUSCULAR | Status: AC
  Administered 2016-07-27: 2 mg

## 2016-07-27 MED ORDER — MIDAZOLAM HCL 2 MG/2ML IJ SOLN: 2.0000 mg | Freq: Once | INTRAMUSCULAR | Status: AC

## 2016-07-27 MED ORDER — LABETALOL HCL 100 MG PO TABS
300.0000 mg | ORAL_TABLET | Freq: Three times a day (TID) | ORAL | Status: DC
  Administered 2016-07-28 – 2016-07-29 (×5): 300 mg via ORAL
  Filled 2016-07-27 (×5): qty 3

## 2016-07-27 MED ORDER — ETOMIDATE 2 MG/ML IV SOLN
20.0000 mg | Freq: Once | INTRAVENOUS | Status: AC
  Administered 2016-07-27: 20 mg via INTRAVENOUS

## 2016-07-27 MED ORDER — ORAL CARE MOUTH RINSE
15.0000 mL | OROMUCOSAL | Status: DC
  Administered 2016-07-27 – 2016-08-08 (×115): 15 mL via OROMUCOSAL

## 2016-07-27 MED ORDER — FENTANYL CITRATE (PF) 100 MCG/2ML IJ SOLN: 100.0000 ug | Freq: Once | INTRAMUSCULAR | Status: AC

## 2016-07-27 MED ORDER — POTASSIUM CHLORIDE 20 MEQ/15ML (10%) PO SOLN
40.0000 meq | Freq: Three times a day (TID) | ORAL | Status: AC
  Administered 2016-07-27 (×2): 40 meq
  Filled 2016-07-27 (×2): qty 30

## 2016-07-27 MED ORDER — MIDAZOLAM HCL 2 MG/2ML IJ SOLN
INTRAMUSCULAR | Status: AC
  Administered 2016-07-27: 2 mg
  Filled 2016-07-27: qty 2

## 2016-07-27 MED ORDER — FENTANYL CITRATE (PF) 100 MCG/2ML IJ SOLN
INTRAMUSCULAR | Status: AC
  Administered 2016-07-27: 100 ug
  Filled 2016-07-27: qty 2

## 2016-07-27 NOTE — Procedures (Signed)
Intubation Procedure Note Seth PerfectRichard M Potter [id number] [birth year]  Procedure: Intubation Indications: Airway protection and maintenance  Procedure Details Consent: emergent Time Out: Verified patient identification, verified procedure, site/side was marked, verified correct patient position, special equipment/implants available, medications/allergies/relevent history reviewed, required imaging and test results available.  Performed  Maximum sterile technique was used including gloves, gown, hand hygiene and mask.  MAC and 4    Evaluation Hemodynamic Status: BP stable throughout; O2 sats: stable throughout Patient's Current Condition: stable Complications: No apparent complications Patient did tolerate procedure well. Chest X-ray ordered to verify placement.  CXR: tube position acceptable.   Berenice PrimasSharp, Taliana Mersereau S 07/27/2016

## 2016-07-27 NOTE — Progress Notes (Addendum)
STROKE TEAM PROGRESS NOTE   SUBJECTIVE (INTERVAL HISTORY) His RN is at the bedside. Wife arrived later. Pt still did not following commands this am. He still moves right UE and LE, not purposeful. Had desat after CT head repeat and was put on re-breather. CT head showed no significant change from 2 days ago. However, due to respiratory decline, CCM consulted and eventually pt was intubated for airway protection.    OBJECTIVE Temp:  [98.8 F (37.1 C)-102.9 F (39.4 C)] 102.9 F (39.4 C) (11/08 2000) Pulse Rate:  [53-104] 99 (11/08 1900) Cardiac Rhythm: Normal sinus rhythm (11/08 0100) Resp:  [14-31] 27 (11/08 1900) BP: (122-165)/(52-71) 134/62 (11/08 1900) SpO2:  [89 %-100 %] 100 % (11/08 1915) FiO2 (%):  [50 %-100 %] 50 % (11/08 1915) Weight:  [168 lb 10.4 oz (76.5 kg)] 168 lb 10.4 oz (76.5 kg) (11/08 0500)  CBC:   Recent Labs Lab 08/07/2016 1740  07/25/16 0228 07/26/16 0510 07/27/16 0344  WBC 7.7  --  14.9* 9.8 9.0  NEUTROABS 6.1  --  13.4*  --   --   HGB 13.2  < > 12.7* 12.4* 12.2*  HCT 37.5*  < > 36.6* 37.2* 37.4*  MCV 84.5  --  86.7 89.6 90.1  PLT 92*  --  112* 107* 100*  < > = values in this interval not displayed.  Basic Metabolic Panel:   Recent Labs Lab 07/26/16 0510  07/26/16 1544  07/27/16 0344 07/27/16 0830 07/27/16 1842  NA 160*  < >  --   < > 160* 158* 159*  K 3.3*  --   --   --  3.3*  --   --   CL 125*  --   --   --  127*  --   --   CO2 26  --   --   --  25  --   --   GLUCOSE 160*  --   --   --  162*  --   --   BUN 29*  --   --   --  34*  --   --   CREATININE 0.96  --   --   --  1.00  --   --   CALCIUM 9.0  --   --   --  9.0  --   --   MG 2.0  --  2.2  --   --   --   --   PHOS 1.9*  --  2.6  --   --   --   --   < > = values in this interval not displayed.  Lipid Panel:     Component Value Date/Time   CHOL 161 07/25/2016 0228   TRIG 95 07/25/2016 0228   HDL 36 (L) 07/25/2016 0228   CHOLHDL 4.5 07/25/2016 0228   VLDL 19 07/25/2016 0228    LDLCALC 106 (H) 07/25/2016 0228   HgbA1c:  Lab Results  Component Value Date   HGBA1C 8.2 (H) 07/25/2016   Urine Drug Screen:     Component Value Date/Time   LABOPIA NONE DETECTED 2016/08/07 0918   COCAINSCRNUR NONE DETECTED 2016/08/07 0918   LABBENZ NONE DETECTED 2016/08/07 0918   AMPHETMU NONE DETECTED 08/07/16 0918   THCU NONE DETECTED 2016-08-07 0918   LABBARB NONE DETECTED 08/07/16 0918      IMAGING I have personally reviewed the radiological images below and agree with the radiology interpretations.  Ct Head Code Stroke W/o Cm 08-07-16 1. Large  acute intraparenchymal hematoma measuring 64 x 53 x 69 mm (estimated volume 115 cc) centered near the right basal ganglia. Localized mass effect with up to 14 mm of right-to-left shift.  2. Associated intraventricular extension with blood in the lateral and third ventricles. Dilatation of the temporal horns of both lateral ventricles without hydrocephalus.  3. Probable subdural extension with tiny right subdural hematoma measuring up to 3 mm.  4. Underlying chronic microvascular ischemic disease.   CT Head WO Contrast - 07/24/16 1. Unchanged size of large right basal ganglia region parenchymal hemorrhage. Slightly increased edema without increased midline shift. 2. Increased intraventricular hemorrhage with mild interval left lateral ventricular dilatation concerning for developing Hydrocephalus.  Dg Chest Port 1 View 07/25/16 1. Tip of the left IJ catheter is in the projection of the SVC. 2. New right lung opacity, which may reflect asymmetric edema or pneumonia.   TEE Left ventricle: The cavity size was normal. Wall thickness was   increased in a pattern of mild LVH. Systolic function was   vigorous. The estimated ejection fraction was in the range of 65%   to 70%. Wall motion was normal; there were no regional wall   motion abnormalities. Doppler parameters are consistent with   abnormal left ventricular relaxation  (grade 1 diastolic   dysfunction). - Aortic valve: Trileaflet; mildly thickened leaflets. - Left atrium: The atrium was mildly dilated. Impressions: - No cardiac source of emboli was indentified.  Ct Head Wo Contrast 07/27/2016 IMPRESSION: Right basal ganglia and frontal parenchymal hemorrhage with intraventricular extension unchanged. Moderate hydrocephalus unchanged. 7 mm midline shift to the left unchanged.     PHYSICAL EXAM  Temp:  [98.8 F (37.1 C)-102.9 F (39.4 C)] 102.9 F (39.4 C) (11/08 2000) Pulse Rate:  [53-104] 99 (11/08 1900) Resp:  [14-31] 27 (11/08 1900) BP: (122-165)/(52-71) 134/62 (11/08 1900) SpO2:  [89 %-100 %] 100 % (11/08 1915) FiO2 (%):  [50 %-100 %] 50 % (11/08 1915) Weight:  [168 lb 10.4 oz (76.5 kg)] 168 lb 10.4 oz (76.5 kg) (11/08 0500)  General - Well nourished, well developed, obtunded.  Ophthalmologic - Fundi not visualized due to noncooperation.  Cardiovascular - Regular rate and rhythm.  Neuro - obtunded, eyes closed not open with strong stimulation. Not follow commands. PERRL, not blink to visual threat bilaterally. Left facial mild droopy, left UE flaccid, left LE mild withdraw on pain stimulation. RUE at least 4/5, RLE at least 3/5 on pain stimulation. DTR 1+ and no babinski. Sensation, coordination and gait not tested.    ASSESSMENT/PLAN Mr. Ardeth PerfectRichard M Jeancharles is a 65 y.o. 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 He did not receive IV t-PA due to ICH.   ICH: Large acute right intraparenchymal hematoma centered near the right basal ganglia, likely due to long-standing HTN and DM.  Resultant  Left hemiplegia, drowsiness  CT - Large acute intraparenchymal hematoma about 115 mL.  Repeat CT stable hematoma but increased edema and developing hydrocephalus  CT repeat 07/27/16 no significant change  Carotid Doppler - not  ordered  2D Echo - EF 65-70%  LDL - 106  HgbA1c - 8.2  VTE prophylaxis - SCDs Diet NPO time specified  aspirin 81 mg daily prior to admission, now on No antithrombotic secondary to ICH.  Patient counseled to be compliant with his antithrombotic medications  Ongoing aggressive stroke risk factor management  Therapy recommendations:  pending  Disposition:  Pending  Cerebral edema  CT showed Large ICH with midline shift  CT repeat 07/27/16 no significant change  On 3% saline  Na 150-160  Na 158->160->158->159  Family would not consider surgery as it would not alter functional outcome  Respiratory failure  Intubated for airway protection  On vent  CCM on board  CXR - bibasilar atelectasis or pneumonia greatest on the right.   Hypertension  Stable  BP goal < 160  Still on cleviprex low dose  Add lisinopril and amlodipine for BP control  Increase labetalol tp 300mg  tid for BP control  Wean off cleviprex as able  Hyperlipidemia  Home meds:  Fish oil and cinnamon prior to admission  LDL 106 goal < 70  Hold off statin at this time due to large ICH  Diabetes  HgbA1c 8.2, goal < 7.0  Uncontrolled  On insulin drip  SSI  Dysphagia   Did not pass swallow  on TF for nutrition  Blood culture NGTD - repeat blood culture  Fever  CXR - bibasilar atelectasis or pneumonia greatest on the right.   Sputum culture pending  UA pending   Cooling blanket  Other Stroke Risk Factors  Advanced age  Hx of ICH 15 years ago without residue  Other Active Problems  Mild thrombocytopenia 112->107->100  Hypokalemia - supplement  Hospital day # 4  This patient is critically ill due to large right hemisphere ICH, hypertension, uncontrolled diabetes, respiratory failure and at significant risk of neurological worsening, death form hematoma expansion, cerebral edema, brain herniation, DKA, hypertensive emergency. This patient's care requires constant  monitoring of vital signs, hemodynamics, respiratory and cardiac monitoring, review of multiple databases, neurological assessment, discussion with family, other specialists and medical decision making of high complexity. I spent 40 minutes of neurocritical care time in the care of this patient.  Marvel PlanJindong Deldrick Linch, MD PhD Stroke Neurology 07/27/2016 9:16 PM

## 2016-07-27 NOTE — Progress Notes (Signed)
ABG results given to RN. 

## 2016-07-27 NOTE — Progress Notes (Signed)
PULMONARY / CRITICAL CARE MEDICINE   Name: Seth Potter MRN: [id number] DOB: [birth year]    ADMISSION DATE:  07/28/2016 CONSULTATION DATE:  07/27/2016  REFERRING MD:  Dr. Roda ShuttersXu, neurology.  CHIEF COMPLAINT:  Acute respiratory failure, unable to protect airway.  HISTORY OF PRESENT ILLNESS:   65 year old male with an acute hypertensive bleed who presents with AMS and inability to protect his airway.  PCCM consulted for intubation and medical management.  PAST MEDICAL HISTORY :  He  has a past medical history of Diabetes mellitus without complication (HCC) and Kidney stones.  PAST SURGICAL HISTORY: He  has a past surgical history that includes Appendectomy.  No Known Allergies  No current facility-administered medications on file prior to encounter.    Current Outpatient Prescriptions on File Prior to Encounter  Medication Sig  . aspirin EC 81 MG tablet Take 81 mg by mouth daily.   . fish oil-omega-3 fatty acids 1000 MG capsule Take 1 g by mouth daily.   Marland Kitchen. ibuprofen (ADVIL,MOTRIN) 600 MG tablet Take 1 tablet (600 mg total) by mouth every 8 (eight) hours as needed for pain. (Patient not taking: Reported on 07/31/2016)  . ondansetron (ZOFRAN ODT) 8 MG disintegrating tablet Take 1 tablet (8 mg total) by mouth every 8 (eight) hours as needed for nausea. (Patient not taking: Reported on 07/24/2016)  . oxyCODONE-acetaminophen (PERCOCET/ROXICET) 5-325 MG per tablet Take 1 tablet by mouth every 4 (four) hours as needed for pain. (Patient not taking: Reported on 08/12/2016)    FAMILY HISTORY:  His has no family status information on file.    SOCIAL HISTORY: He  reports that he has never smoked. He does not have any smokeless tobacco history on file. He reports that he does not drink alcohol or use drugs.  REVIEW OF SYSTEMS:   Unattainable, ICH  SUBJECTIVE:  Unresponsive  VITAL SIGNS: BP (!) 163/52   Pulse 87   Temp 99.6 F (37.6 C) (Axillary)   Resp (!) 27   Ht 6' (1.829 m)  Comment: per wife  Wt 76.5 kg (168 lb 10.4 oz)   SpO2 99%   BMI 22.87 kg/m   HEMODYNAMICS:    VENTILATOR SETTINGS:    INTAKE / OUTPUT: I/O last 3 completed shifts: In: 3926.9 [I.V.:1196.9; Other:40; NG/GT:2540; IV Piggyback:150] Out: 3355 [Urine:3355]  PHYSICAL EXAMINATION: General:  Acutely ill appearing male, RR of 40. Neuro:  Unresponsive, withdraws right to pain but not following commands. HEENT:  [place]/AT, PERRL, EOM spontaneous. Cardiovascular:  RRR, Nl S1/S2, -M/R/G. Lungs:  CTA bilaterally. Abdomen:  Soft, NT, ND and +BS. Musculoskeletal:  -edema and -tenderness. Skin:  Intact.  LABS:  BMET  Recent Labs Lab 07/25/16 0228  07/26/16 0510  07/26/16 2144 07/27/16 0344 07/27/16 0830  NA 148*  < > 160*  < > 162* 160* 158*  K 3.2*  --  3.3*  --   --  3.3*  --   CL 115*  --  125*  --   --  127*  --   CO2 25  --  26  --   --  25  --   BUN 16  --  29*  --   --  34*  --   CREATININE 0.89  --  0.96  --   --  1.00  --   GLUCOSE 143*  --  160*  --   --  162*  --   < > = values in this interval not displayed.  Electrolytes  Recent Labs Lab 07/25/16 0228  07/25/16 1545 07/26/16 0510 07/26/16 1544 07/27/16 0344  CALCIUM 9.2  --   --  9.0  --  9.0  MG  --   < > 1.8 2.0 2.2  --   PHOS  --   < > 3.4 1.9* 2.6  --   < > = values in this interval not displayed.  CBC  Recent Labs Lab 07/25/16 0228 07/26/16 0510 07/27/16 0344  WBC 14.9* 9.8 9.0  HGB 12.7* 12.4* 12.2*  HCT 36.6* 37.2* 37.4*  PLT 112* 107* 100*    Coag's  Recent Labs Lab 10-10-15 1832  APTT 23*  INR 0.99    Sepsis Markers No results for input(s): LATICACIDVEN, PROCALCITON, O2SATVEN in the last 168 hours.  ABG  Recent Labs Lab 07/27/16 0555  PHART 7.527*  PCO2ART 27.5*  PO2ART 65.0*    Liver Enzymes  Recent Labs Lab 10-10-15 1740  AST 19  ALT 14*  ALKPHOS 54  BILITOT 0.7  ALBUMIN 3.9    Cardiac Enzymes  Recent Labs Lab 07/27/16 0615  TROPONINI 0.11*     Glucose  Recent Labs Lab 07/27/16 0401 07/27/16 0508 07/27/16 0611 07/27/16 0756 07/27/16 0848 07/27/16 0948  GLUCAP 140* 163* 206* 214* 194* 171*    Imaging Ct Head Wo Contrast  Result Date: 07/27/2016 CLINICAL DATA:  Intra cerebral hemorrhage. EXAM: CT HEAD WITHOUT CONTRAST TECHNIQUE: Contiguous axial images were obtained from the base of the skull through the vertex without intravenous contrast. COMPARISON:  CT head 07/24/2016 FINDINGS: Brain: Large hemorrhage in the right basal ganglia and posterior frontal lobe is unchanged in size from the prior study. There is intraventricular hemorrhage with moderate blood in the ventricles unchanged. No new area of hemorrhage Moderate hydrocephalus is unchanged from the prior study. 7 mm midline shift to the left also unchanged. Chronic infarct left occipital pole unchanged. Vascular: Negative Skull: Negative Sinuses/Orbits: Negative Other: None IMPRESSION: Right basal ganglia and frontal parenchymal hemorrhage with intraventricular extension unchanged. Moderate hydrocephalus unchanged. 7 mm midline shift to the left unchanged. Electronically Signed   By: Marlan Palauharles  Clark M.D.   On: 07/27/2016 08:59   Dg Chest Port 1 View  Result Date: 07/27/2016 CLINICAL DATA:  Respiratory failure and stroke. EXAM: PORTABLE CHEST 1 VIEW COMPARISON:  07/25/2016 FINDINGS: Left central venous catheter tip directed laterally in the mid SVC region, likely at the junction of the SVC and brachiocephalic vein. Enteric tube tip is off the field of view but below the left hemidiaphragm. Shallow inspiration. Normal heart size and pulmonary vascularity. Increasing infiltration or atelectasis in the right lung base since previous study. Calcified aorta. IMPRESSION: Appliances are unchanged in position. Increasing infiltration or atelectasis in the right lung base since previous study. Electronically Signed   By: Burman NievesWilliam  Stevens M.D.   On: 07/27/2016 06:46     STUDIES:   11/8 head CT with increased hemorrhage and midline shift.  CULTURES: None  ANTIBIOTICS: None  SIGNIFICANT EVENTS: 11/8 intubation for airway protection  LINES/TUBES: ETT 11/8>>>  DISCUSSION: 65 year old male with hypertensive ICH now with increased bleeding and midline shift.   ASSESSMENT / PLAN:  PULMONARY A: VDRF due to inability to protect airway. P:   - Intubate. - Full vent support. - ABG and CXR f/u.  CARDIOVASCULAR A:  HTN P:  - Cleviprex  - Labetalol - Lisinopril - Tele monitoring. - BP control per neurology.  RENAL A:   Hypernatremia P:   - Hypernatremia per neuro. -  BMET in AM - Replace electrolytes as indicated.  GASTROINTESTINAL A:   No active issues. P:   - TF in AM.  HEMATOLOGIC A:   Mild anemia P:  - CBC in AM. - Transfuse per ICU protocol.  INFECTIOUS A:   No active issues. P:   - WBC and fever curve. - Watch for signs of aspiration.  ENDOCRINE A:   No active issues   P:   - Monitor.  NEUROLOGIC A:   ICH Acute encephalopathy P:   RASS goal: 0 - Propofol. - Fentanyl drip. - Neurology consult.  FAMILY  - Updates: No family bedside.  - Inter-disciplinary family meet or Palliative Care meeting due by:  11/15  The patient is critically ill with multiple organ systems failure and requires high complexity decision making for assessment and support, frequent evaluation and titration of therapies, application of advanced monitoring technologies and extensive interpretation of multiple databases.   Critical Care Time devoted to patient care services described in this note is  35  Minutes. This time reflects time of care of this signee Dr Koren Bound. This critical care time does not reflect procedure time, or teaching time or supervisory time of PA/NP/Med student/Med Resident etc but could involve care discussion time.  Alyson Reedy, M.D. [facility] Pulmonary/Critical Care Medicine. Pager: [phone]. After hours  pager: [phone].  07/27/2016, 12:01 PM

## 2016-07-27 NOTE — Progress Notes (Signed)
Pt intubated by CCM. 100 fentanyl, 2 versed, 20 atomidate and 50 rocuronium given per order.

## 2016-07-27 NOTE — Care Management Important Message (Signed)
Important Message  Patient Details  Name: Ardeth PerfectRichard M Daubenspeck MRN: [id number] Date of Birth: Feb 10, 1951   Medicare Important Message Given:  Other (see comment)    Koleman Marling Abena 07/27/2016, 10:40 AM

## 2016-07-27 NOTE — Progress Notes (Signed)
Called regarding increased O2 requirements following CT head with decreased O2 sats. Due to impaired cognition, patient unable to describe symptoms. Most likely etiology would be aspiration while supine for CT. Less likely would be PE or MI.   Obtaining STAT respiratory consult for suctioning, CXR, EKG, Troponin level and ABG.  CT head shows subtle improvement relative to prior study.   Electronically signed: Dr. Caryl PinaEric Mahlon Gabrielle

## 2016-07-27 NOTE — Progress Notes (Signed)
Troponin result and incresed O2 requirements text paged to Dr. Roda ShuttersXu.

## 2016-07-27 NOTE — Progress Notes (Signed)
Patient is clinically stable relative to previous documented exams.   Na of 162. Has been off hypertonic saline. No free water flushes with his tube feed regimen. Starting 200 mL free water flushes q8h. Continue to monitor Na levels.   Repeat CT head pending.   Electronically signed: Dr. Caryl PinaEric Aina Rossbach

## 2016-07-27 NOTE — Procedures (Signed)
Intubation Procedure Note Seth PerfectRichard M Potter [id number] May 03, 1951  Procedure: Intubation Indications: Airway protection and maintenance  Procedure Details Consent: Unable to obtain consent because of emergent medical necessity. Time Out: Verified patient identification, verified procedure, site/side was marked, verified correct patient position, special equipment/implants available, medications/allergies/relevent history reviewed, required imaging and test results available.  Performed  Maximum sterile technique was used including gloves, hand hygiene and mask.  MAC    Evaluation Hemodynamic Status: BP stable throughout; O2 sats: stable throughout Patient's Current Condition: stable Complications: No apparent complications Patient did tolerate procedure well. Chest X-ray ordered to verify placement.  CXR: pending.   YACOUB,WESAM 07/27/2016

## 2016-07-27 NOTE — Plan of Care (Signed)
Problem: Tissue Perfusion: Goal: Cerebral tissue perfusion will improve (applicable to all stroke diagnoses) Outcome: Not Progressing Pt condition has been clinically worsening. Additional interventions such as mechanical ventilation have been added.

## 2016-07-28 ENCOUNTER — Inpatient Hospital Stay (HOSPITAL_COMMUNITY): Payer: Medicare Other

## 2016-07-28 DIAGNOSIS — R739 Hyperglycemia, unspecified: Secondary | ICD-10-CM

## 2016-07-28 DIAGNOSIS — R069 Unspecified abnormalities of breathing: Secondary | ICD-10-CM

## 2016-07-28 DIAGNOSIS — E785 Hyperlipidemia, unspecified: Secondary | ICD-10-CM

## 2016-07-28 DIAGNOSIS — J989 Respiratory disorder, unspecified: Secondary | ICD-10-CM

## 2016-07-28 LAB — BASIC METABOLIC PANEL
Anion gap: 9 (ref 5–15)
BUN: 53 mg/dL — AB (ref 6–20)
BUN: 53 mg/dL — ABNORMAL HIGH (ref 6–20)
CALCIUM: 8.6 mg/dL — AB (ref 8.9–10.3)
CO2: 24 mmol/L (ref 22–32)
CO2: 24 mmol/L (ref 22–32)
CREATININE: 1.45 mg/dL — AB (ref 0.61–1.24)
CREATININE: 1.39 mg/dL — AB (ref 0.61–1.24)
Calcium: 8.8 mg/dL — ABNORMAL LOW (ref 8.9–10.3)
Chloride: 127 mmol/L — ABNORMAL HIGH (ref 101–111)
Chloride: >130 mmol/L (ref 101–111)
GFR calc Af Amer: 56 mL/min — ABNORMAL LOW (ref >60)
GFR, EST AFRICAN AMERICAN: 59 mL/min — AB (ref >60)
GFR, EST NON AFRICAN AMERICAN: 48 mL/min — AB (ref >60)
GFR, EST NON AFRICAN AMERICAN: 51 mL/min — AB (ref >60)
GLUCOSE: 224 mg/dL — AB (ref 65–99)
Glucose, Bld: 145 mg/dL — ABNORMAL HIGH (ref 65–99)
Potassium: 3.1 mmol/L — ABNORMAL LOW (ref 3.5–5.1)
Potassium: 3.3 mmol/L — ABNORMAL LOW (ref 3.5–5.1)
SODIUM: 160 mmol/L — AB (ref 135–145)
SODIUM: 158 mmol/L — AB (ref 135–145)

## 2016-07-28 LAB — CBC
HCT: 35 % — ABNORMAL LOW (ref 39.0–52.0)
Hemoglobin: 11.3 g/dL — ABNORMAL LOW (ref 13.0–17.0)
MCH: 29.6 pg (ref 26.0–34.0)
MCHC: 32.3 g/dL (ref 30.0–36.0)
MCV: 91.6 fL (ref 78.0–100.0)
PLATELETS: 91 10*3/uL — AB (ref 150–400)
RBC: 3.82 MIL/uL — ABNORMAL LOW (ref 4.22–5.81)
RDW: 14.1 % (ref 11.5–15.5)
WBC: 9.5 10*3/uL (ref 4.0–10.5)

## 2016-07-28 LAB — URINALYSIS W MICROSCOPIC (NOT AT ARMC)
Bilirubin Urine: NEGATIVE
GLUCOSE, UA: 100 mg/dL — AB
Hgb urine dipstick: NEGATIVE
Ketones, ur: NEGATIVE mg/dL
Leukocytes, UA: NEGATIVE
Nitrite: NEGATIVE
PH: 5 (ref 5.0–8.0)
Protein, ur: 100 mg/dL — AB
RBC / HPF: NONE SEEN RBC/hpf (ref 0–5)
SPECIFIC GRAVITY, URINE: 1.025 (ref 1.005–1.030)
WBC UA: NONE SEEN WBC/hpf (ref 0–5)

## 2016-07-28 LAB — GLUCOSE, CAPILLARY
GLUCOSE-CAPILLARY: 185 mg/dL — AB (ref 65–99)
GLUCOSE-CAPILLARY: 194 mg/dL — AB (ref 65–99)
GLUCOSE-CAPILLARY: 158 mg/dL — AB (ref 65–99)
GLUCOSE-CAPILLARY: 150 mg/dL — AB (ref 65–99)
GLUCOSE-CAPILLARY: 150 mg/dL — AB (ref 65–99)
GLUCOSE-CAPILLARY: 185 mg/dL — AB (ref 65–99)
GLUCOSE-CAPILLARY: 134 mg/dL — AB (ref 65–99)
GLUCOSE-CAPILLARY: 127 mg/dL — AB (ref 65–99)
GLUCOSE-CAPILLARY: 133 mg/dL — AB (ref 65–99)
GLUCOSE-CAPILLARY: 169 mg/dL — AB (ref 65–99)
GLUCOSE-CAPILLARY: 187 mg/dL — AB (ref 65–99)
GLUCOSE-CAPILLARY: 184 mg/dL — AB (ref 65–99)
GLUCOSE-CAPILLARY: 212 mg/dL — AB (ref 65–99)
Glucose-Capillary: 117 mg/dL — ABNORMAL HIGH (ref 65–99)
Glucose-Capillary: 131 mg/dL — ABNORMAL HIGH (ref 65–99)
Glucose-Capillary: 140 mg/dL — ABNORMAL HIGH (ref 65–99)
Glucose-Capillary: 153 mg/dL — ABNORMAL HIGH (ref 65–99)
Glucose-Capillary: 153 mg/dL — ABNORMAL HIGH (ref 65–99)
Glucose-Capillary: 176 mg/dL — ABNORMAL HIGH (ref 65–99)
Glucose-Capillary: 179 mg/dL — ABNORMAL HIGH (ref 65–99)
Glucose-Capillary: 204 mg/dL — ABNORMAL HIGH (ref 65–99)
Glucose-Capillary: 204 mg/dL — ABNORMAL HIGH (ref 65–99)
Glucose-Capillary: 230 mg/dL — ABNORMAL HIGH (ref 65–99)

## 2016-07-28 LAB — SODIUM
SODIUM: 160 mmol/L — AB (ref 135–145)
SODIUM: 160 mmol/L — AB (ref 135–145)
Sodium: 160 mmol/L — ABNORMAL HIGH (ref 135–145)

## 2016-07-28 LAB — BLOOD GAS, ARTERIAL
ACID-BASE DEFICIT: 0.2 mmol/L (ref 0.0–2.0)
BICARBONATE: 22.4 mmol/L (ref 20.0–28.0)
DRAWN BY: 419771
FIO2: 40
LHR: 14 {breaths}/min
MECHVT: 620 mL
O2 Saturation: 97.9 %
PATIENT TEMPERATURE: 98.6
PCO2 ART: 27.5 mmHg — AB (ref 32.0–48.0)
PEEP/CPAP: 5 cmH2O
PO2 ART: 94.9 mmHg (ref 83.0–108.0)
pH, Arterial: 7.522 — ABNORMAL HIGH (ref 7.350–7.450)

## 2016-07-28 LAB — PHOSPHORUS: Phosphorus: 2.2 mg/dL — ABNORMAL LOW (ref 2.5–4.6)

## 2016-07-28 LAB — MAGNESIUM: MAGNESIUM: 2.1 mg/dL (ref 1.7–2.4)

## 2016-07-28 MED ORDER — VANCOMYCIN HCL 10 G IV SOLR
1500.0000 mg | Freq: Once | INTRAVENOUS | Status: AC
  Administered 2016-07-28: 1500 mg via INTRAVENOUS
  Filled 2016-07-28: qty 1500

## 2016-07-28 MED ORDER — VANCOMYCIN HCL IN DEXTROSE 750-5 MG/150ML-% IV SOLN
750.0000 mg | Freq: Two times a day (BID) | INTRAVENOUS | Status: DC
  Administered 2016-07-28 – 2016-07-29 (×3): 750 mg via INTRAVENOUS
  Filled 2016-07-28 (×4): qty 150

## 2016-07-28 MED ORDER — DEXTROSE 5 % IV SOLN
2.0000 g | Freq: Once | INTRAVENOUS | Status: AC
  Administered 2016-07-28: 2 g via INTRAVENOUS
  Filled 2016-07-28: qty 2

## 2016-07-28 MED ORDER — DEXTROSE 5 % IV SOLN
1.0000 g | Freq: Three times a day (TID) | INTRAVENOUS | Status: DC
  Administered 2016-07-28 – 2016-07-30 (×6): 1 g via INTRAVENOUS
  Filled 2016-07-28 (×7): qty 1

## 2016-07-28 NOTE — Progress Notes (Signed)
Nutrition Follow-up  INTERVENTION:   Continue:  Jevity 1.2 @ 65 ml/hr (1560 ml/day) 30 ml Prostat BID Provides: 2072 kcal, 116 grams protein, and 1265 ml H2O.   NUTRITION DIAGNOSIS:   Inadequate oral intake related to inability to eat as evidenced by NPO status. Ongoing.  GOAL:   Patient will meet greater than or equal to 90% of their needs Met.   MONITOR:   TF tolerance, I & O's, Vent status  ASSESSMENT:   Pt with hx of HTN and MD who was admitted with unresponsiveness. CT shows large ICH with 14 mm shift.   Pt discussed during ICU rounds and with RN.  11/6 Cortrak- tip in proximal jejunum 11/8 intubated  Patient is currently intubated on ventilator support MV: 13.5 L/min Temp (24hrs), Avg:101 F (38.3 C), Min:99 F (37.2 C), Max:102.9 F (39.4 C)  Medications reviewed and include: senokot-s, insulin drip, 3% hypertonic  Labs reviewed: Na 158, K+3.3, CL >130 CBG's: 150-140-150   Diet Order:  Diet NPO time specified  Skin:  Reviewed, no issues  Last BM:  11/8 rectal tube   Height:   Ht Readings from Last 1 Encounters:  07/25/16 6' (1.829 m)    Weight:   Wt Readings from Last 1 Encounters:  07/28/16 171 lb 4.8 oz (77.7 kg)    Ideal Body Weight:  80.9 kg  BMI:  Body mass index is 23.23 kg/m.  Estimated Nutritional Needs:   Kcal:  2154  Protein:  105-115 grams  Fluid:  > 2 L/day  EDUCATION NEEDS:   No education needs identified at this time  [place], [place], [place] Pager [phone] After Hours Pager

## 2016-07-28 NOTE — Progress Notes (Signed)
Pharmacy Antibiotic Note  Seth Potter is a 65 y.o. male admitted on 07/27/2016 with pneumonia.  Pharmacy has been consulted for vancomycin and ceftazidime dosing. Tmax is 102.9 and WBC is WNL. Scr bumped up today to 1.45.   Plan: - Vancomycin 1500mg  IV x 1 then 750mg  IV Q12H - Ceftazidime 2gm IV x 1 then 1gm IV Q8H - F/u renal fxn, C&S, clinical status and trough at SS  Height: 6' (182.9 cm) (per wife) Weight: 171 lb 4.8 oz (77.7 kg) IBW/kg (Calculated) : 77.6  Temp (24hrs), Avg:100.6 F (38.1 C), Min:99 F (37.2 C), Max:102.9 F (39.4 C)   Recent Labs Lab 08/10/2016 1740 08/18/2016 1743 07/25/16 0228 07/26/16 0510 07/27/16 0344 07/28/16 0530  WBC 7.7  --  14.9* 9.8 9.0 9.5  CREATININE 0.86 0.80 0.89 0.96 1.00 1.45*    Estimated Creatinine Clearance: 54.3 mL/min (by C-G formula based on SCr of 1.45 mg/dL (H)).    No Known Allergies  Antimicrobials this admission: Vanc 11/9>> Ceftaz 11/9>>  Dose adjustments this admission: N/A  Microbiology results: 11/8 TA - pending 11/8 Blood - pending 11/5 Blood - NGTD 11/5 Urine - NEG 11/4 MRSA - NEG  Thank you for allowing pharmacy to be a part of this patient's care.  Jhoana Upham, Seth Potter 07/28/2016 10:46 AM

## 2016-07-28 NOTE — Care Management Note (Signed)
Case Management Note  Patient Details  Name: Seth Potter MRN: [id number] Date of Birth: November 28, 1950  Subjective/Objective:   Pt admitted on 07/31/2016 s/p basal ganglia ICH.  PTA, pt independent, lives with spouse.                   Action/Plan: Pt remains sedated and on ventilator.  Will follow for discharge planning as pt progresses.    Expected Discharge Date:                  Expected Discharge Plan:  IP Rehab Facility  In-House Referral:     Discharge planning Services  CM Consult  Post Acute Care Choice:    Choice offered to:     DME Arranged:    DME Agency:     HH Arranged:    HH Agency:     Status of Service:  In process, will continue to follow  If discussed at Long Length of Stay Meetings, dates discussed:    Additional Comments:  Glennon Macmerson, Keirston Saephanh M, RN 07/28/2016, 4:40 PM

## 2016-07-28 NOTE — Progress Notes (Signed)
STROKE TEAM PROGRESS NOTE   SUBJECTIVE (INTERVAL HISTORY) No family is at the bedside. Pt was intubated yesterday for airway protection. Na 160. He still move RUE and RLE intermittently but much less than yesterday. BP stable, off cleviprex. Still on insulin drip. Still febrile, culture negative and fever could be due to central fever.   OBJECTIVE Temp:  [99 F (37.2 C)-102.9 F (39.4 C)] 101.8 F (38.8 C) (11/09 0400) Pulse Rate:  [70-104] 92 (11/09 0600) Cardiac Rhythm: Normal sinus rhythm (11/08 2100) Resp:  [14-33] 25 (11/09 0600) BP: (87-164)/(50-71) 114/64 (11/09 0600) SpO2:  [96 %-100 %] 97 % (11/09 0600) FiO2 (%):  [40 %-100 %] 40 % (11/09 0302) Weight:  [77.7 kg (171 lb 4.8 oz)] 77.7 kg (171 lb 4.8 oz) (11/09 0421)  CBC:   Recent Labs Lab 08/08/2016 1740  07/25/16 0228  07/27/16 0344 07/28/16 0530  WBC 7.7  --  14.9*  < > 9.0 9.5  NEUTROABS 6.1  --  13.4*  --   --   --   HGB 13.2  < > 12.7*  < > 12.2* 11.3*  HCT 37.5*  < > 36.6*  < > 37.4* 35.0*  MCV 84.5  --  86.7  < > 90.1 91.6  PLT 92*  --  112*  < > 100* 91*  < > = values in this interval not displayed.  Basic Metabolic Panel:   Recent Labs Lab 07/26/16 1544  07/27/16 0344  07/27/16 1842 07/28/16 0530  NA  --   < > 160*  < > 159* 160*  K  --   --  3.3*  --   --  3.1*  CL  --   --  127*  --   --  127*  CO2  --   --  25  --   --  24  GLUCOSE  --   --  162*  --   --  224*  BUN  --   --  34*  --   --  53*  CREATININE  --   --  1.00  --   --  1.45*  CALCIUM  --   --  9.0  --   --  8.8*  MG 2.2  --   --   --   --  2.1  PHOS 2.6  --   --   --   --  2.2*  < > = values in this interval not displayed.  Lipid Panel:     Component Value Date/Time   CHOL 161 07/25/2016 0228   TRIG 95 07/25/2016 0228   HDL 36 (L) 07/25/2016 0228   CHOLHDL 4.5 07/25/2016 0228   VLDL 19 07/25/2016 0228   LDLCALC 106 (H) 07/25/2016 0228   HgbA1c:  Lab Results  Component Value Date   HGBA1C 8.2 (H) 07/25/2016   Urine Drug  Screen:     Component Value Date/Time   LABOPIA NONE DETECTED 08/09/2016 0918   COCAINSCRNUR NONE DETECTED 08/07/2016 0918   LABBENZ NONE DETECTED 08/06/2016 0918   AMPHETMU NONE DETECTED 08/08/2016 0918   THCU NONE DETECTED 07/30/2016 0918   LABBARB NONE DETECTED 07/31/2016 0918      IMAGING I have personally reviewed the radiological images below and agree with the radiology interpretations.  Ct Head Code Stroke W/o Cm 08/10/2016 1. Large acute intraparenchymal hematoma measuring 64 x 53 x 69 mm (estimated volume 115 cc) centered near the right basal ganglia. Localized mass effect with up to 14  mm of right-to-left shift.  2. Associated intraventricular extension with blood in the lateral and third ventricles. Dilatation of the temporal horns of both lateral ventricles without hydrocephalus.  3. Probable subdural extension with tiny right subdural hematoma measuring up to 3 mm.  4. Underlying chronic microvascular ischemic disease.   CT Head WO Contrast - 07/24/16 1. Unchanged size of large right basal ganglia region parenchymal hemorrhage. Slightly increased edema without increased midline shift. 2. Increased intraventricular hemorrhage with mild interval left lateral ventricular dilatation concerning for developing Hydrocephalus.  Dg Chest Port 1 View 07/25/16 1. Tip of the left IJ catheter is in the projection of the SVC. 2. New right lung opacity, which may reflect asymmetric edema or pneumonia.   TEE Left ventricle: The cavity size was normal. Wall thickness was   increased in a pattern of mild LVH. Systolic function was   vigorous. The estimated ejection fraction was in the range of 65%   to 70%. Wall motion was normal; there were no regional wall   motion abnormalities. Doppler parameters are consistent with   abnormal left ventricular relaxation (grade 1 diastolic   dysfunction). - Aortic valve: Trileaflet; mildly thickened leaflets. - Left atrium: The atrium was mildly  dilated. Impressions: - No cardiac source of emboli was indentified.  Ct Head Wo Contrast 07/27/2016 IMPRESSION: Right basal ganglia and frontal parenchymal hemorrhage with intraventricular extension unchanged. Moderate hydrocephalus unchanged. 7 mm midline shift to the left unchanged.     PHYSICAL EXAM  Temp:  [99 F (37.2 C)-102.9 F (39.4 C)] 101.8 F (38.8 C) (11/09 0400) Pulse Rate:  [70-104] 92 (11/09 0600) Resp:  [14-33] 25 (11/09 0600) BP: (87-164)/(50-71) 114/64 (11/09 0600) SpO2:  [96 %-100 %] 97 % (11/09 0600) FiO2 (%):  [40 %-100 %] 40 % (11/09 0302) Weight:  [77.7 kg (171 lb 4.8 oz)] 77.7 kg (171 lb 4.8 oz) (11/09 0421)  General - Well nourished, well developed, intubated.  Ophthalmologic - Fundi not visualized due to noncooperation.  Cardiovascular - Regular rate and rhythm.  Neuro - intubated, eyes closed, no respond to voice. Not follow commands. PERRL, not blink to visual threat bilaterally, positive corneal and gag. Left UE flaccid, left LE withdraw 2/5 on pain stimulation. RUE at least 4/5, RLE at least 3/5 on pain stimulation. DTR 1+ and no babinski. Sensation, coordination and gait not tested.    ASSESSMENT/PLAN Seth Potter is a 65 y.o. 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 He did not receive IV t-PA due to ICH.   ICH: Large acute right intraparenchymal hematoma centered near the right basal ganglia, likely due to long-standing HTN and DM.  Resultant  Left hemiplegia, drowsiness  CT - Large acute intraparenchymal hematoma about 115 mL.  Repeat CT stable hematoma but increased edema and developing hydrocephalus  CT repeat 07/27/16 no significant change  Repeat CT in am  Carotid Doppler - not ordered  2D Echo - EF 65-70%  LDL - 106  HgbA1c - 8.2  VTE prophylaxis - SCDs Diet NPO time specified  aspirin 81 mg daily  prior to admission, now on No antithrombotic secondary to ICH.  Patient counseled to be compliant with his antithrombotic medications  Ongoing aggressive stroke risk factor management  Therapy recommendations:  pending  Disposition:  Pending  Cerebral edema  CT showed Large ICH with midline shift  CT repeat 07/27/16 no significant change  On  3% saline  Na 150-160  Na 158->160->158->159->160  Family would not consider surgery as it would not alter functional outcome  Respiratory failure  Intubated for airway protection  On vent  CCM on board  CXR - improved bibasilar atelectasis or pneumonia on the right.   Hypertension  Stable  BP goal < 160  Off cleviprex  on lisinopril and amlodipine for BP control  on labetalol tp 300mg  tid for BP control  Hyperlipidemia  Home meds:  Fish oil and cinnamon prior to admission  LDL 106 goal < 70  Hold off statin at this time due to large ICH  Diabetes  HgbA1c 8.2, goal < 7.0  Uncontrolled  On insulin drip  SSI  Dysphagia   Did not pass swallow  on TF for nutrition  Fever -  Could be central fever  CXR - improved bibasilar atelectasis or pneumonia on the right.   Sputum culture pending  UA negative   Blood culture NGTD  Cooling blanket  Other Stroke Risk Factors  Advanced age  Hx of ICH 15 years ago without residue  Other Active Problems  Mild thrombocytopenia 112->107->100->91  Hypokalemia - supplement  Hospital day # 5  This patient is critically ill due to large right hemisphere ICH, hypertension, uncontrolled diabetes, central fever and at significant risk of neurological worsening, death form hematoma expansion, cerebral edema, brain herniation, DKA, hypertensive emergency. This patient's care requires constant monitoring of vital signs, hemodynamics, respiratory and cardiac monitoring, review of multiple databases, neurological assessment, discussion with family, other specialists and  medical decision making of high complexity. I spent 40 minutes of neurocritical care time in the care of this patient.  Marvel Plan, MD PhD Stroke Neurology 07/28/2016 8:39 PM

## 2016-07-28 NOTE — Progress Notes (Signed)
PULMONARY / CRITICAL CARE MEDICINE   Name: Seth Potter MRN: [id number] DOB: 07/11/1951    ADMISSION DATE:  07/21/2016 CONSULTATION DATE:  07/27/2016  REFERRING MD:  Dr. Roda ShuttersXu, neurology.  CHIEF COMPLAINT:  Acute respiratory failure, unable to protect airway.  HISTORY OF PRESENT ILLNESS:   65 year old male with an acute hypertensive bleed who presents with AMS and inability to protect his airway.  PCCM consulted for intubation and medical management.  SUBJECTIVE:  ett placed  VITAL SIGNS: BP 114/64   Pulse 92   Temp (!) 101.8 F (38.8 C) (Axillary)   Resp (!) 25   Ht 6' (1.829 m) Comment: per wife  Wt 77.7 kg (171 lb 4.8 oz)   SpO2 97%   BMI 23.23 kg/m   HEMODYNAMICS:    VENTILATOR SETTINGS: Vent Mode: PRVC FiO2 (%):  [40 %-100 %] 40 % Set Rate:  [14 bmp] 14 bmp Vt Set:  [409[620 mL] 620 mL PEEP:  [5 cmH20] 5 cmH20 Plateau Pressure:  [16 cmH20-18 cmH20] 16 cmH20  INTAKE / OUTPUT: I/O last 3 completed shifts: In: 3049.1 [I.V.:359.1; Other:40; NG/GT:2600; IV Piggyback:50] Out: 2700 [Urine:2700]  PHYSICAL EXAMINATION: General:  Acutely ill appearing male Neuro:  Purpose on rt, not opening yetes HEENT:  [place]/AT, PERRL, EOM spontaneous. Cardiovascular:  RRR, Nl S1/S2, -M/R/G. Lungs:  Coarse , ant clear Abdomen:  Soft, NT, ND and +BS. Musculoskeletal:  -edema and -tenderness. Skin:  Intact.  LABS:  BMET  Recent Labs Lab 07/26/16 0510  07/27/16 0344 07/27/16 0830 07/27/16 1842 07/28/16 0530  NA 160*  < > 160* 158* 159* 160*  K 3.3*  --  3.3*  --   --  3.1*  CL 125*  --  127*  --   --  127*  CO2 26  --  25  --   --  24  BUN 29*  --  34*  --   --  53*  CREATININE 0.96  --  1.00  --   --  1.45*  GLUCOSE 160*  --  162*  --   --  224*  < > = values in this interval not displayed.  Electrolytes  Recent Labs Lab 07/26/16 0510 07/26/16 1544 07/27/16 0344 07/28/16 0530  CALCIUM 9.0  --  9.0 8.8*  MG 2.0 2.2  --  2.1  PHOS 1.9* 2.6  --  2.2*     CBC  Recent Labs Lab 07/26/16 0510 07/27/16 0344 07/28/16 0530  WBC 9.8 9.0 9.5  HGB 12.4* 12.2* 11.3*  HCT 37.2* 37.4* 35.0*  PLT 107* 100* 91*    Coag's  Recent Labs Lab 07/31/2016 1832  APTT 23*  INR 0.99    Sepsis Markers No results for input(s): LATICACIDVEN, PROCALCITON, O2SATVEN in the last 168 hours.  ABG  Recent Labs Lab 07/27/16 0555 07/27/16 1308 07/28/16 0319  PHART 7.527* 7.500* 7.522*  PCO2ART 27.5* 33.7 27.5*  PO2ART 65.0* 134* 94.9    Liver Enzymes  Recent Labs Lab 07/22/2016 1740  AST 19  ALT 14*  ALKPHOS 54  BILITOT 0.7  ALBUMIN 3.9    Cardiac Enzymes  Recent Labs Lab 07/27/16 0615  TROPONINI 0.11*    Glucose  Recent Labs Lab 07/28/16 0230 07/28/16 0345 07/28/16 0444 07/28/16 0547 07/28/16 0700 07/28/16 0809  GLUCAP 153* 176* 204* 185* 194* 158*    Imaging Dg Chest Port 1 View  Result Date: 07/28/2016 CLINICAL DATA:  Stroke symptoms EXAM: PORTABLE CHEST 1 VIEW COMPARISON:  Portable chest x-ray  of 07/27/2016 FINDINGS: Aeration of the lung bases has improved with decreasing atelectasis particularly at the right lung base. The tip of the endotracheal tube is approximately 3.7 cm above the carina. Left IJ central venous line tip overlies the mid SVC. No pneumothorax is seen. Heart size is stable. IMPRESSION: 1. Improved aeration with decreasing basilar linear atelectasis. 2. Endotracheal tube and left central venous line unchanged in position. Electronically Signed   By: Dwyane DeePaul  Barry M.D.   On: 07/28/2016 08:06   Dg Chest Port 1 View  Result Date: 07/27/2016 CLINICAL DATA:  Acute respiratory failure, intubated patient. Intracranial hemorrhage. History of diabetes. EXAM: PORTABLE CHEST 1 VIEW COMPARISON:  Portable chest x-ray of July 27, 2016 FINDINGS: The patient has undergone interval intubation of the trachea. There is increased density at both lung bases consistent with atelectasis. On the right there may be lower lobe  pneumonia as well. There is no pleural effusion or pneumothorax. The heart and pulmonary vascularity are normal. The mediastinum is normal in width. The endotracheal tube tip lies 4 cm above the carina. The feeding tube tip projects below the inferior margin of the image. The left internal jugular venous catheter tip projects over the proximal SVC. IMPRESSION: Slight interval worsening of bibasilar atelectasis or pneumonia greatest on the right. The endotracheal tube is in reasonable position. The other support tubes are in stable position. Electronically Signed   By: David  SwazilandJordan M.D.   On: 07/27/2016 12:35     STUDIES:  11/8 head CT with increased hemorrhage and midline shift.  CULTURES: 11/5 BC>>> 11/8 BC>>> 11/8 sputum>>> 11/5 urine>>>  ANTIBIOTICS: None  SIGNIFICANT EVENTS: 11/8 intubation for airway protection  LINES/TUBES: ETT 11/8>>>  DISCUSSION: 65 year old male with hypertensive ICH now with increased bleeding and midline shift.   ASSESSMENT / PLAN:  PULMONARY A: VDRF due to inability to protect airway. ARF ASP, improved rt base collapse P:   - add abx, await sputum -ABg reviewed, alkalotic, reduce MV SBT okay today, no extubation given neuro  CARDIOVASCULAR A:  HTN P:  - Cleviprex off - Labetalol - Lisinopril- dc with rise crt - Tele monitoring. - BP control per neurology Avoid free water  RENAL A:   Hypernatremia hypoK P:   - Hypernatremia per neuro. - BMET in AM - slow reduction to goal na 155 - k supp  GASTROINTESTINAL A:   No active issues. P:   - TF start -pepcid  HEMATOLOGIC A:   Mild anemia Drop in plat (dilution? Sepsis?) P:  - CBC in AM for plat further, may need transfusion - Transfuse per ICU protocol.  INFECTIOUS A:   Asp PNA P:   - WBC and fever curve. - sputum snt yesterday Add vanc ceftaz.  ENDOCRINE A:   Insulin drip, hyperglcyemia P:   - Monitor on drip  NEUROLOGIC A:   ICH Acute encephalopathy P:    RASS goal: 0 - Fentanyl drip off - Neurology pt  FAMILY  - Updates: No family bedside.  - Inter-disciplinary family meet or Palliative Care meeting due by:  11/15  ccm time 35 min   [name], MD, FACP Pgr: [phone] [facility]

## 2016-07-29 ENCOUNTER — Inpatient Hospital Stay (HOSPITAL_COMMUNITY): Payer: Medicare Other

## 2016-07-29 DIAGNOSIS — Z794 Long term (current) use of insulin: Secondary | ICD-10-CM

## 2016-07-29 LAB — CULTURE, BLOOD (ROUTINE X 2)
CULTURE: NO GROWTH
Culture: NO GROWTH

## 2016-07-29 LAB — CBC WITH DIFFERENTIAL/PLATELET
BASOS PCT: 0 %
Basophils Absolute: 0 10*3/uL (ref 0.0–0.1)
EOS PCT: 0 %
Eosinophils Absolute: 0 10*3/uL (ref 0.0–0.7)
HCT: 34.1 % — ABNORMAL LOW (ref 39.0–52.0)
HEMOGLOBIN: 10.8 g/dL — AB (ref 13.0–17.0)
LYMPHS ABS: 0.9 10*3/uL (ref 0.7–4.0)
Lymphocytes Relative: 9 %
MCH: 29.4 pg (ref 26.0–34.0)
MCHC: 31.7 g/dL (ref 30.0–36.0)
MCV: 92.9 fL (ref 78.0–100.0)
MONOS PCT: 3 %
Monocytes Absolute: 0.4 10*3/uL (ref 0.1–1.0)
NEUTROS PCT: 88 %
Neutro Abs: 9.2 10*3/uL — ABNORMAL HIGH (ref 1.7–7.7)
PLATELETS: 101 10*3/uL — AB (ref 150–400)
RBC: 3.67 MIL/uL — AB (ref 4.22–5.81)
RDW: 14.1 % (ref 11.5–15.5)
WBC: 10.4 10*3/uL (ref 4.0–10.5)

## 2016-07-29 LAB — SODIUM
SODIUM: 161 mmol/L — AB (ref 135–145)
SODIUM: 161 mmol/L — AB (ref 135–145)
Sodium: 160 mmol/L — ABNORMAL HIGH (ref 135–145)

## 2016-07-29 LAB — BASIC METABOLIC PANEL
BUN: 59 mg/dL — AB (ref 6–20)
CALCIUM: 8.5 mg/dL — AB (ref 8.9–10.3)
CO2: 25 mmol/L (ref 22–32)
CREATININE: 1.48 mg/dL — AB (ref 0.61–1.24)
GFR calc non Af Amer: 47 mL/min — ABNORMAL LOW (ref >60)
GFR, EST AFRICAN AMERICAN: 55 mL/min — AB (ref >60)
Glucose, Bld: 135 mg/dL — ABNORMAL HIGH (ref 65–99)
Potassium: 3.4 mmol/L — ABNORMAL LOW (ref 3.5–5.1)
SODIUM: 161 mmol/L — AB (ref 135–145)

## 2016-07-29 LAB — GLUCOSE, CAPILLARY
GLUCOSE-CAPILLARY: 118 mg/dL — AB (ref 65–99)
GLUCOSE-CAPILLARY: 135 mg/dL — AB (ref 65–99)
GLUCOSE-CAPILLARY: 136 mg/dL — AB (ref 65–99)
GLUCOSE-CAPILLARY: 138 mg/dL — AB (ref 65–99)
GLUCOSE-CAPILLARY: 167 mg/dL — AB (ref 65–99)
GLUCOSE-CAPILLARY: 174 mg/dL — AB (ref 65–99)
GLUCOSE-CAPILLARY: 201 mg/dL — AB (ref 65–99)
GLUCOSE-CAPILLARY: 214 mg/dL — AB (ref 65–99)
GLUCOSE-CAPILLARY: 221 mg/dL — AB (ref 65–99)
GLUCOSE-CAPILLARY: 223 mg/dL — AB (ref 65–99)
Glucose-Capillary: 157 mg/dL — ABNORMAL HIGH (ref 65–99)
Glucose-Capillary: 187 mg/dL — ABNORMAL HIGH (ref 65–99)
Glucose-Capillary: 133 mg/dL — ABNORMAL HIGH (ref 65–99)
Glucose-Capillary: 152 mg/dL — ABNORMAL HIGH (ref 65–99)
Glucose-Capillary: 151 mg/dL — ABNORMAL HIGH (ref 65–99)
Glucose-Capillary: 163 mg/dL — ABNORMAL HIGH (ref 65–99)
Glucose-Capillary: 158 mg/dL — ABNORMAL HIGH (ref 65–99)
Glucose-Capillary: 169 mg/dL — ABNORMAL HIGH (ref 65–99)
Glucose-Capillary: 174 mg/dL — ABNORMAL HIGH (ref 65–99)
Glucose-Capillary: 149 mg/dL — ABNORMAL HIGH (ref 65–99)

## 2016-07-29 MED ORDER — PANTOPRAZOLE SODIUM 40 MG PO PACK
40.0000 mg | PACK | Freq: Every day | ORAL | Status: DC
  Administered 2016-07-29 – 2016-08-08 (×11): 40 mg
  Filled 2016-07-29 (×10): qty 20

## 2016-07-29 MED ORDER — INSULIN ASPART 100 UNIT/ML ~~LOC~~ SOLN
2.0000 [IU] | SUBCUTANEOUS | Status: DC
  Administered 2016-07-29: 6 [IU] via SUBCUTANEOUS

## 2016-07-29 MED ORDER — HEPARIN SODIUM (PORCINE) 5000 UNIT/ML IJ SOLN
5000.0000 [IU] | Freq: Three times a day (TID) | INTRAMUSCULAR | Status: DC
  Administered 2016-07-29 – 2016-08-08 (×30): 5000 [IU] via SUBCUTANEOUS
  Filled 2016-07-29 (×30): qty 1

## 2016-07-29 MED ORDER — INSULIN GLARGINE 100 UNIT/ML ~~LOC~~ SOLN
30.0000 [IU] | SUBCUTANEOUS | Status: DC
  Administered 2016-07-29: 30 [IU] via SUBCUTANEOUS
  Filled 2016-07-29: qty 0.3

## 2016-07-29 MED ORDER — TAMSULOSIN HCL 0.4 MG PO CAPS
0.4000 mg | ORAL_CAPSULE | Freq: Every day | ORAL | Status: DC
  Administered 2016-07-29 – 2016-08-05 (×7): 0.4 mg via ORAL
  Filled 2016-07-29 (×7): qty 1

## 2016-07-29 MED ORDER — GLUCERNA 1.2 CAL PO LIQD
1000.0000 mL | ORAL | Status: DC
  Administered 2016-07-29 – 2016-08-08 (×13): 1000 mL
  Filled 2016-07-29 (×20): qty 1000

## 2016-07-29 MED ORDER — LABETALOL HCL 100 MG PO TABS
200.0000 mg | ORAL_TABLET | Freq: Three times a day (TID) | ORAL | Status: DC
  Administered 2016-07-29 – 2016-08-08 (×29): 200 mg via ORAL
  Filled 2016-07-29 (×29): qty 2

## 2016-07-29 NOTE — Progress Notes (Signed)
STROKE TEAM PROGRESS NOTE   SUBJECTIVE (INTERVAL HISTORY) Wife and daughter and son are at the bedside. Na 161. He still intubated but less moving RUE and RLE. BP stable, on po BP meds. Still on insulin drip. Still febrile, sputum culture showed Staph Auras, now on Fortaz and vanco. Repeat CT this am, unchanged.   OBJECTIVE Temp:  [99 F (37.2 C)-102.7 F (39.3 C)] 102.4 F (39.1 C) (11/10 1545) Pulse Rate:  [83-96] 90 (11/10 1554) Cardiac Rhythm: Normal sinus rhythm (11/10 0800) Resp:  [20-34] 25 (11/10 1554) BP: (102-144)/(50-65) 123/59 (11/10 1554) SpO2:  [97 %-100 %] 99 % (11/10 1554) FiO2 (%):  [40 %] 40 % (11/10 1555) Weight:  [169 lb 15.6 oz (77.1 kg)] 169 lb 15.6 oz (77.1 kg) (11/10 0331)  CBC:   Recent Labs Lab 07/25/16 0228  07/28/16 0530 07/29/16 0635  WBC 14.9*  < > 9.5 10.4  NEUTROABS 13.4*  --   --  9.2*  HGB 12.7*  < > 11.3* 10.8*  HCT 36.6*  < > 35.0* 34.1*  MCV 86.7  < > 91.6 92.9  PLT 112*  < > 91* 101*  < > = values in this interval not displayed.  Basic Metabolic Panel:   Recent Labs Lab 07/26/16 1544  07/28/16 0530 07/28/16 0930  07/29/16 0635 07/29/16 0944  NA  --   < > 160* 158*  < > 161* 161*  K  --   < > 3.1* 3.3*  --  3.4*  --   CL  --   < > 127* >130*  --  >130*  --   CO2  --   < > 24 24  --  25  --   GLUCOSE  --   < > 224* 145*  --  135*  --   BUN  --   < > 53* 53*  --  59*  --   CREATININE  --   < > 1.45* 1.39*  --  1.48*  --   CALCIUM  --   < > 8.8* 8.6*  --  8.5*  --   MG 2.2  --  2.1  --   --   --   --   PHOS 2.6  --  2.2*  --   --   --   --   < > = values in this interval not displayed.  Lipid Panel:     Component Value Date/Time   CHOL 161 07/25/2016 0228   TRIG 95 07/25/2016 0228   HDL 36 (L) 07/25/2016 0228   CHOLHDL 4.5 07/25/2016 0228   VLDL 19 07/25/2016 0228   LDLCALC 106 (H) 07/25/2016 0228   HgbA1c:  Lab Results  Component Value Date   HGBA1C 8.2 (H) 07/25/2016   Urine Drug Screen:     Component Value  Date/Time   LABOPIA NONE DETECTED 08/02/2016 0918   COCAINSCRNUR NONE DETECTED 07/21/2016 0918   LABBENZ NONE DETECTED 07/29/2016 0918   AMPHETMU NONE DETECTED 08/01/2016 0918   THCU NONE DETECTED 08/16/2016 0918   LABBARB NONE DETECTED 07/26/2016 0918      IMAGING I have personally reviewed the radiological images below and agree with the radiology interpretations.  Ct Head Code Stroke W/o Cm 07/29/2016 1. Large acute intraparenchymal hematoma measuring 64 x 53 x 69 mm (estimated volume 115 cc) centered near the right basal ganglia. Localized mass effect with up to 14 mm of right-to-left shift.  2. Associated intraventricular extension with blood in the lateral  and third ventricles. Dilatation of the temporal horns of both lateral ventricles without hydrocephalus.  3. Probable subdural extension with tiny right subdural hematoma measuring up to 3 mm.  4. Underlying chronic microvascular ischemic disease.   CT Head WO Contrast - 07/24/16 1. Unchanged size of large right basal ganglia region parenchymal hemorrhage. Slightly increased edema without increased midline shift. 2. Increased intraventricular hemorrhage with mild interval left lateral ventricular dilatation concerning for developing Hydrocephalus.  Dg Chest Port 1 View 07/25/16 1. Tip of the left IJ catheter is in the projection of the SVC. 2. New right lung opacity, which may reflect asymmetric edema or pneumonia.   TEE Left ventricle: The cavity size was normal. Wall thickness was   increased in a pattern of mild LVH. Systolic function was   vigorous. The estimated ejection fraction was in the range of 65%   to 70%. Wall motion was normal; there were no regional wall   motion abnormalities. Doppler parameters are consistent with   abnormal left ventricular relaxation (grade 1 diastolic   dysfunction). - Aortic valve: Trileaflet; mildly thickened leaflets. - Left atrium: The atrium was mildly dilated. Impressions: - No  cardiac source of emboli was indentified.  Ct Head Wo Contrast 07/27/2016 IMPRESSION: Right basal ganglia and frontal parenchymal hemorrhage with intraventricular extension unchanged. Moderate hydrocephalus unchanged. 7 mm midline shift to the left unchanged.   07/29/2016 IMPRESSION: Right hemispheric hemorrhage unchanged. Intraventricular hemorrhage unchanged. 8 mm midline shift to the left unchanged. Mild hydrocephalus unchanged    PHYSICAL EXAM  Temp:  [99 F (37.2 C)-102.7 F (39.3 C)] 102.4 F (39.1 C) (11/10 1545) Pulse Rate:  [83-96] 90 (11/10 1554) Resp:  [20-34] 25 (11/10 1554) BP: (102-144)/(50-65) 123/59 (11/10 1554) SpO2:  [97 %-100 %] 99 % (11/10 1554) FiO2 (%):  [40 %] 40 % (11/10 1555) Weight:  [169 lb 15.6 oz (77.1 kg)] 169 lb 15.6 oz (77.1 kg) (11/10 0331)  General - Well nourished, well developed, intubated.  Ophthalmologic - Fundi not visualized due to noncooperation.  Cardiovascular - Regular rate and rhythm.  Neuro - intubated, eyes closed, no respond to voice. Not follow commands. PERRL, not blink to visual threat bilaterally, positive corneal and gag. Left UE flaccid, left LE withdraw 2/5 on pain stimulation. RUE at least 3/5, RLE at least 2/5 on pain stimulation. DTR 1+ and no babinski. Sensation, coordination and gait not tested.    ASSESSMENT/PLAN Seth Potter is a 65 y.o. 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 He did not receive IV t-PA due to ICH.   ICH: Large acute right intraparenchymal hematoma centered near the right basal ganglia, likely due to long-standing HTN and DM.  Resultant  Left hemiplegia, drowsiness  CT - Large acute intraparenchymal hematoma about 115 mL.  Repeat CT stable hematoma but increased edema and developing hydrocephalus  CT repeat 07/27/16 no significant change  CT repeat 07/29/16 no significant  change  Carotid Doppler - not ordered  2D Echo - EF 65-70%  LDL - 106  HgbA1c - 8.2  VTE prophylaxis - heparin subq Diet NPO time specified  aspirin 81 mg daily prior to admission, now on No antithrombotic secondary to ICH.  Patient counseled to be compliant with his antithrombotic medications  Ongoing aggressive stroke risk factor management  Therapy recommendations:  pending  Disposition:  Pending  Cerebral edema  CT showed Large ICH with midline shift  CT repeat 07/27/16 and 07/29/16 no significant change  On 3% saline  Na 150-160  Na 158->160->158->159->160->161  Respiratory failure  Intubated for airway protection  On vent  CCM on board  CXR - improved bibasilar atelectasis or pneumonia on the right.   Hypertension  Stable  BP goal < 160  Off cleviprex  on lisinopril and amlodipine for BP control  on labetalol tp 200mg  tid for BP control  Hyperlipidemia  Home meds:  Fish oil and cinnamon prior to admission  LDL 106 goal < 70  Hold off statin at this time due to large ICH  Diabetes  HgbA1c 8.2, goal < 7.0  Uncontrolled  On insulin drip  SSI  Dysphagia   Did not pass swallow  on TF for nutrition  Fever -  Could be central fever  CXR - improved bibasilar atelectasis or pneumonia on the right.   Sputum culture showed Staph. Auras  UA negative   Blood culture NGTD  On Fortaz and vanco now  Other Stroke Risk Factors  Advanced age  Hx of ICH 15 years ago without residue  Other Active Problems  Mild thrombocytopenia 112->107->100->91->101  Hypokalemia - supplement  Hospital day # 6  This patient is critically ill due to large right hemisphere ICH, hypertension, uncontrolled diabetes, central fever and at significant risk of neurological worsening, death form hematoma expansion, cerebral edema, brain herniation, DKA, hypertensive emergency. This patient's care requires constant monitoring of vital signs,  hemodynamics, respiratory and cardiac monitoring, review of multiple databases, neurological assessment, discussion with family, other specialists and medical decision making of high complexity. I had long discussion with family at bedside, updated them about current condition, next step on treatment and potential prognosis. I spent 50 minutes of neurocritical care time in the care of this patient.  Marvel PlanJindong Keldric Poyer, MD PhD Stroke Neurology 07/29/2016 4:31 PM

## 2016-07-29 NOTE — Care Management Important Message (Signed)
Important Message  Patient Details  Name: Seth Potter MRN: [id number] Date of Birth: 02-Aug-1951   Medicare Important Message Given:  Other (see comment)    Ramere Downs Abena 07/29/2016, 10:57 AM

## 2016-07-29 NOTE — Progress Notes (Signed)
Nutrition Brief Note  Pt discussed during ICU rounds and with RN.  Pt had continued to have elevated blood sugars on insulin drip.  CBG (last 3)   Recent Labs  07/29/16 1113 07/29/16 1213 07/29/16 1328  GLUCAP 214* 223* 163*     Will adjust TF to Glucerna 1.2 @ 65 ml/hr (1560 ml/day) Continue 30 ml Prostat BID Provides: 2072 kcal, 123 grams protein, and 1265 ml H2O.   Kendell BaneHeather Jhordan Kinter RD, LDN, CNSC [phone] Pager [phone] After Hours Pager

## 2016-07-29 NOTE — Progress Notes (Signed)
Pt is back from CT no events noted. Pt transported on 100%

## 2016-07-29 NOTE — Progress Notes (Signed)
PULMONARY / CRITICAL CARE MEDICINE   Name: Seth Potter MRN: [id number] DOB: [birth year]    ADMISSION DATE:  08/04/2016 CONSULTATION DATE:  07/27/2016  REFERRING MD:  Dr. Roda ShuttersXu, neurology.  CHIEF COMPLAINT:  Acute respiratory failure, unable to protect airway.  HISTORY OF PRESENT ILLNESS:   65 year old male with an acute hypertensive bleed who presents with AMS and inability to protect his airway.  PCCM consulted for intubation and medical management.  SUBJECTIVE:  Remains on vent.  VITAL SIGNS: BP 121/60   Pulse 92   Temp (!) 102.7 F (39.3 C) (Axillary)   Resp (!) 27   Ht 6' (1.829 m) Comment: per wife  Wt 169 lb 15.6 oz (77.1 kg)   SpO2 99%   BMI 23.05 kg/m   VENTILATOR SETTINGS: Vent Mode: PRVC FiO2 (%):  [40 %] 40 % Set Rate:  [14 bmp] 14 bmp Vt Set:  [620 mL] 620 mL PEEP:  [5 cmH20] 5 cmH20 Pressure Support:  [5 cmH20] 5 cmH20 Plateau Pressure:  [13 cmH20-16 cmH20] 16 cmH20  INTAKE / OUTPUT: I/O last 3 completed shifts: In: 3675.4 [I.V.:335.4; NG/GT:2340; IV Piggyback:1000] Out: 1825 [Urine:1825]  PHYSICAL EXAMINATION: General: unresponsive Neuro: flaccid on Lt HEENT: ETT in place Cardiovascular: regular, no murmur Lungs: b/l rhonchi Abdomen:  Soft, non tender Musculoskeletal: no edema Skin: no rashes  LABS:  BMET  Recent Labs Lab 07/27/16 0344  07/28/16 0530 07/28/16 0930 07/28/16 1700 07/28/16 2155  NA 160*  < > 160* 158* 160* 160*  K 3.3*  --  3.1* 3.3*  --   --   CL 127*  --  127* >130*  --   --   CO2 25  --  24 24  --   --   BUN 34*  --  53* 53*  --   --   CREATININE 1.00  --  1.45* 1.39*  --   --   GLUCOSE 162*  --  224* 145*  --   --   < > = values in this interval not displayed.  Electrolytes  Recent Labs Lab 07/26/16 0510 07/26/16 1544 07/27/16 0344 07/28/16 0530 07/28/16 0930  CALCIUM 9.0  --  9.0 8.8* 8.6*  MG 2.0 2.2  --  2.1  --   PHOS 1.9* 2.6  --  2.2*  --     CBC  Recent Labs Lab 07/27/16 0344  07/28/16 0530 07/29/16 0635  WBC 9.0 9.5 10.4  HGB 12.2* 11.3* 10.8*  HCT 37.4* 35.0* 34.1*  PLT 100* 91* 101*    Coag's  Recent Labs Lab 08/06/2016 1832  APTT 23*  INR 0.99    Sepsis Markers No results for input(s): LATICACIDVEN, PROCALCITON, O2SATVEN in the last 168 hours.  ABG  Recent Labs Lab 07/27/16 0555 07/27/16 1308 07/28/16 0319  PHART 7.527* 7.500* 7.522*  PCO2ART 27.5* 33.7 27.5*  PO2ART 65.0* 134* 94.9    Liver Enzymes  Recent Labs Lab 08/13/2016 1740  AST 19  ALT 14*  ALKPHOS 54  BILITOT 0.7  ALBUMIN 3.9    Cardiac Enzymes  Recent Labs Lab 07/27/16 0615  TROPONINI 0.11*    Glucose  Recent Labs Lab 07/29/16 0201 07/29/16 0304 07/29/16 0321 07/29/16 0427 07/29/16 0547 07/29/16 0636  GLUCAP 157* 138* 135* 187* 133* 136*    Imaging No results found.   STUDIES:  11/7 Echo >> mild LVH, EF 65 to 70%, grade 1 DD 11/8 head CT with increased hemorrhage and midline shift.  CULTURES: 11/5 BC>>>  11/5 urine>>>negative 11/8 BC>>>  ANTIBIOTICS: 11/09 Vancomycin >> 11/09 Elita QuickFortaz >>   SIGNIFICANT EVENTS: 11/8 intubation for airway protection  LINES/TUBES: Lt IJ CVL 11/06 >> ETT 11/8>>>  DISCUSSION: 65 year old male with hypertensive ICH now with increased bleeding and midline shift.   ASSESSMENT / PLAN:  Acute hypoxic respiratory failure with inability to protect airway in setting of ICH and aspiration pneumonia. - full vent support - f/u CXR - day 2 of Abx  ICH with cerebral edema. - medically induced hypernatremia per neurology  HTN emergency. Hx of HLD. - goal SBP < 160 - continue norvasc, labetalol  Nutrition. - tube feeds  DM type II. - insulin gtt  DVT prophylaxis - SCDs SUP - protonix Goals of care - Full code  CC time 31 minutes.  Coralyn HellingVineet Craige Patel, MD [facility] 07/29/2016, 7:40 AM Pager:  [phone] After 3pm call: [phone]

## 2016-07-30 ENCOUNTER — Inpatient Hospital Stay (HOSPITAL_COMMUNITY): Payer: Medicare Other

## 2016-07-30 LAB — GLUCOSE, CAPILLARY
GLUCOSE-CAPILLARY: 135 mg/dL — AB (ref 65–99)
GLUCOSE-CAPILLARY: 142 mg/dL — AB (ref 65–99)
GLUCOSE-CAPILLARY: 148 mg/dL — AB (ref 65–99)
GLUCOSE-CAPILLARY: 153 mg/dL — AB (ref 65–99)
GLUCOSE-CAPILLARY: 165 mg/dL — AB (ref 65–99)
GLUCOSE-CAPILLARY: 180 mg/dL — AB (ref 65–99)
GLUCOSE-CAPILLARY: 209 mg/dL — AB (ref 65–99)
GLUCOSE-CAPILLARY: 237 mg/dL — AB (ref 65–99)
GLUCOSE-CAPILLARY: 261 mg/dL — AB (ref 65–99)
GLUCOSE-CAPILLARY: 264 mg/dL — AB (ref 65–99)
GLUCOSE-CAPILLARY: 293 mg/dL — AB (ref 65–99)
Glucose-Capillary: 121 mg/dL — ABNORMAL HIGH (ref 65–99)
Glucose-Capillary: 128 mg/dL — ABNORMAL HIGH (ref 65–99)
Glucose-Capillary: 129 mg/dL — ABNORMAL HIGH (ref 65–99)
Glucose-Capillary: 153 mg/dL — ABNORMAL HIGH (ref 65–99)
Glucose-Capillary: 162 mg/dL — ABNORMAL HIGH (ref 65–99)
Glucose-Capillary: 171 mg/dL — ABNORMAL HIGH (ref 65–99)
Glucose-Capillary: 201 mg/dL — ABNORMAL HIGH (ref 65–99)
Glucose-Capillary: 217 mg/dL — ABNORMAL HIGH (ref 65–99)
Glucose-Capillary: 288 mg/dL — ABNORMAL HIGH (ref 65–99)

## 2016-07-30 LAB — CBC
HEMATOCRIT: 32.1 % — AB (ref 39.0–52.0)
Hemoglobin: 10 g/dL — ABNORMAL LOW (ref 13.0–17.0)
MCH: 28.9 pg (ref 26.0–34.0)
MCHC: 31.2 g/dL (ref 30.0–36.0)
MCV: 92.8 fL (ref 78.0–100.0)
Platelets: 85 10*3/uL — ABNORMAL LOW (ref 150–400)
RBC: 3.46 MIL/uL — ABNORMAL LOW (ref 4.22–5.81)
RDW: 14.1 % (ref 11.5–15.5)
WBC: 6.4 10*3/uL (ref 4.0–10.5)

## 2016-07-30 LAB — CULTURE, RESPIRATORY W GRAM STAIN

## 2016-07-30 LAB — CULTURE, RESPIRATORY: SPECIAL REQUESTS: NORMAL

## 2016-07-30 LAB — BASIC METABOLIC PANEL
Anion gap: 9 (ref 5–15)
BUN: 65 mg/dL — AB (ref 6–20)
CALCIUM: 8.6 mg/dL — AB (ref 8.9–10.3)
CO2: 23 mmol/L (ref 22–32)
CREATININE: 1.46 mg/dL — AB (ref 0.61–1.24)
Chloride: 127 mmol/L — ABNORMAL HIGH (ref 101–111)
GFR calc non Af Amer: 48 mL/min — ABNORMAL LOW (ref >60)
GFR, EST AFRICAN AMERICAN: 56 mL/min — AB (ref >60)
GLUCOSE: 341 mg/dL — AB (ref 65–99)
Potassium: 3.4 mmol/L — ABNORMAL LOW (ref 3.5–5.1)
Sodium: 159 mmol/L — ABNORMAL HIGH (ref 135–145)

## 2016-07-30 LAB — SODIUM
SODIUM: 160 mmol/L — AB (ref 135–145)
SODIUM: 162 mmol/L — AB (ref 135–145)

## 2016-07-30 LAB — C DIFFICILE QUICK SCREEN W PCR REFLEX
C DIFFICILE (CDIFF) INTERP: NOT DETECTED
C DIFFICILE (CDIFF) TOXIN: NEGATIVE
C DIFFICLE (CDIFF) ANTIGEN: NEGATIVE

## 2016-07-30 MED ORDER — SODIUM CHLORIDE 0.9 % IV SOLN
INTRAVENOUS | Status: DC
  Administered 2016-07-30: 6.7 [IU]/h via INTRAVENOUS
  Administered 2016-07-30: 2 [IU]/h via INTRAVENOUS
  Administered 2016-07-31: 5.1 [IU]/h via INTRAVENOUS
  Administered 2016-08-02: 17:00:00 via INTRAVENOUS
  Filled 2016-07-30 (×4): qty 2.5

## 2016-07-30 MED ORDER — FREE WATER
100.0000 mL | Freq: Three times a day (TID) | Status: DC
  Administered 2016-07-30: 100 mL

## 2016-07-30 MED ORDER — CEFAZOLIN SODIUM-DEXTROSE 2-4 GM/100ML-% IV SOLN
2.0000 g | Freq: Three times a day (TID) | INTRAVENOUS | Status: AC
  Administered 2016-07-30 – 2016-08-04 (×16): 2 g via INTRAVENOUS
  Filled 2016-07-30 (×16): qty 100

## 2016-07-30 MED ORDER — FREE WATER
100.0000 mL | Freq: Three times a day (TID) | Status: DC
  Administered 2016-07-31: 100 mL

## 2016-07-30 NOTE — Progress Notes (Signed)
Dr. Otelia LimesLindzen made aware patient sodium at 1830 162. Continuing with current orders. Continuing to monitor.

## 2016-07-30 NOTE — Progress Notes (Signed)
PULMONARY / CRITICAL CARE MEDICINE   Name: Seth Potter MRN: [id number] DOB: 13-Jan-1951    ADMISSION DATE:  07/22/2016 CONSULTATION DATE:  07/27/2016  REFERRING MD:  Dr. Roda ShuttersXu, neurology.  CHIEF COMPLAINT:  Acute respiratory failure, unable to protect airway.  HISTORY OF PRESENT ILLNESS:   65 year old male with an acute hypertensive bleed who presents with AMS and inability to protect his airway.  PCCM consulted for intubation and medical management.  SUBJECTIVE: Glucose range 220-293, on insulin gtt.  TF changed to glucerna.  Briefly off insulin gtt overnight but had to go back on.  Increased stool output in flexiseal.    VITAL SIGNS: BP (!) 125/58 (BP Location: Left Arm)   Pulse 79   Temp 99.8 F (37.7 C) (Axillary)   Resp (!) 23   Ht 6' (1.829 m) Comment: per wife  Wt 170 lb 3.1 oz (77.2 kg)   SpO2 100%   BMI 23.08 kg/m   VENTILATOR SETTINGS: Vent Mode: PRVC FiO2 (%):  [40 %] 40 % Set Rate:  [14 bmp] 14 bmp Vt Set:  [620 mL] 620 mL PEEP:  [5 cmH20] 5 cmH20 Plateau Pressure:  [14 cmH20-15 cmH20] 14 cmH20  INTAKE / OUTPUT: I/O last 3 completed shifts: In: 3309.8 [I.V.:219.8; NG/GT:2340; IV Piggyback:750] Out: 2800 [Urine:2200; Stool:600]  PHYSICAL EXAMINATION: General: critically ill appearing male, unresponsive Neuro: eyes closed, no response to verbal, withdraws to pain BLE, flicker LUE, RUE attempts to localize HEENT: ETT in place, small bore feeding tube Cardiovascular: regular, no murmur Lungs: non-labored, coarse bilaterally   Abdomen:  Soft, non tender Musculoskeletal: no edema Skin: no rashes  LABS:  BMET  Recent Labs Lab 07/28/16 0930  07/29/16 0635  07/29/16 1634 07/29/16 2205 07/30/16 0547  NA 158*  < > 161*  < > 161* 160* 159*  K 3.3*  --  3.4*  --   --   --  3.4*  CL >130*  --  >130*  --   --   --  127*  CO2 24  --  25  --   --   --  23  BUN 53*  --  59*  --   --   --  65*  CREATININE 1.39*  --  1.48*  --   --   --  1.46*  GLUCOSE  145*  --  135*  --   --   --  341*  < > = values in this interval not displayed.  Electrolytes  Recent Labs Lab 07/26/16 0510 07/26/16 1544  07/28/16 0530 07/28/16 0930 07/29/16 0635 07/30/16 0547  CALCIUM 9.0  --   < > 8.8* 8.6* 8.5* 8.6*  MG 2.0 2.2  --  2.1  --   --   --   PHOS 1.9* 2.6  --  2.2*  --   --   --   < > = values in this interval not displayed.  CBC  Recent Labs Lab 07/28/16 0530 07/29/16 0635 07/30/16 0547  WBC 9.5 10.4 6.4  HGB 11.3* 10.8* 10.0*  HCT 35.0* 34.1* 32.1*  PLT 91* 101* 85*    Coag's  Recent Labs Lab 08/03/2016 1832  APTT 23*  INR 0.99    Sepsis Markers No results for input(s): LATICACIDVEN, PROCALCITON, O2SATVEN in the last 168 hours.  ABG  Recent Labs Lab 07/27/16 0555 07/27/16 1308 07/28/16 0319  PHART 7.527* 7.500* 7.522*  PCO2ART 27.5* 33.7 27.5*  PO2ART 65.0* 134* 94.9    Liver Enzymes  Recent Labs Lab 08/16/2016 1740  AST 19  ALT 14*  ALKPHOS 54  BILITOT 0.7  ALBUMIN 3.9    Cardiac Enzymes  Recent Labs Lab 07/27/16 0615  TROPONINI 0.11*    Glucose  Recent Labs Lab 07/29/16 2013 07/29/16 2322 07/30/16 0354 07/30/16 0557 07/30/16 0651 07/30/16 0826  GLUCAP 118* 221* 264* 288* 293* 261*    Imaging Dg Chest Port 1 View  Result Date: 07/30/2016 CLINICAL DATA:  Respiratory failure.  Shortness of breath. EXAM: PORTABLE CHEST 1 VIEW COMPARISON:  July 29, 2016 FINDINGS: The ETT and left central line are in stable position. No pneumothorax. Persistent opacity in the right base, mildly more prominent in the interval. No other changes. IMPRESSION: 1. Stable support apparatus. 2. Opacity in the right base is mildly more prominent. Recommend follow-up to resolution. Electronically Signed   By: Gerome Samavid  Williams III M.D   On: 07/30/2016 07:23     STUDIES:  11/7 ECHO >> mild LVH, EF 65 to 70%, grade 1 DD 11/8 CT Head >> increased hemorrhage and midline shift.  CULTURES: 11/5 BC >> negative  11/5  urine >> negative 11/8 BC >> 11/8 Sputum >> MSSA  ANTIBIOTICS: Vancomycin 11/09 >> 11/11 Elita QuickFortaz 11/09 >> 11/11 Ancef 11/11 >>   SIGNIFICANT EVENTS: 11/04  Admit with AMS, acute hypertensive bleed 11/08  intubation for airway protection  LINES/TUBES: Lt IJ CVL 11/06 >> 11/11 ETT 11/8 >>  DISCUSSION: [age] year old male with hypertensive ICH now with increased bleeding and midline shift.   ASSESSMENT / PLAN:  Acute hypoxic respiratory failure with inability to protect airway in setting of ICH and aspiration pneumonia. - PRVC 8 cc/kg - wean PEEP / FiO2 for sats >92% - daily SBT as able > vent mechanics on 5/5 look good, mental status barrier to extubation - f/u CXR - day 3 of Abx  ICH with cerebral edema. - completed induced hypernatremia per neurology 11/5  HTN emergency. Hx of HLD. - goal SBP < 160 - continue norvasc, labetalol - d/c central line  Hypokalemia  - trend BMP / UOP  - replace electrolytes as indicated   Thrombocytopenia  - monitor CBC, evidence of bleeding  Nutrition. - tube feeding per nutrition  DM type II. - insulin gtt  BPH  -continue flomax, verified with pharmacy can be opened/crushed, females should not touch / wear gloves when handling -I/O cath as needed   DVT prophylaxis - SCDs SUP - protonix Goals of care - Full code  Canary BrimBrandi Telsa Dillavou, NP-C [place] [facility] Pgr: [phone] or if no answer [phone] 07/30/2016, 9:06 AM

## 2016-07-30 NOTE — Progress Notes (Signed)
NA level 162 @ 1800

## 2016-07-30 NOTE — Progress Notes (Addendum)
S/O: Called by nursing regarding Na level increased to 162. Off hypertonic saline.   BP 122/62   Pulse 86   Temp (!) 101.7 F (38.7 C) (Axillary)   Resp 19   Ht 6' (1.829 m) Comment: per wife  Wt 77.2 kg (170 lb 3.1 oz)   SpO2 100%   BMI 23.08 kg/m   Exam reveals comatose patient who does not awaken to sternal rub. Pupils symmetric and sluggishly reactive.Right upper extremity withdrawal with sternal rub is a relatively subtle finding. Triple flexion reflex to RLE with noxious stimulus. LUE and LLE no movement to light noxious stimuli.    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 Intubated without sedation. Hypertonic saline previously held. Na above 162. Restarting free water flushes at 100 mL TID. Hold flushes 4 hours prior to any procedure requiring patient to lie flat. Continue to monitor sodium levels. Discussed with family.   15 minutes of ICU time.   Electronically signed: Dr. Caryl PinaEric Yarnell Arvidson

## 2016-07-30 NOTE — Progress Notes (Signed)
STROKE TEAM PROGRESS NOTE   SUBJECTIVE (INTERVAL HISTORY) No family is at the bedside. Na 159. Neuro stable overnight. He still intubated but less moving RUE and RLE. BP stable, on po BP meds. Still on insulin drip for hyperglycemia. Afebrile overnight, still on Fortaz and vanco.    OBJECTIVE Temp:  [97.6 F (36.4 C)-102.5 F (39.2 C)] 99.8 F (37.7 C) (11/11 0743) Pulse Rate:  [77-92] 79 (11/11 0800) Cardiac Rhythm: Normal sinus rhythm (11/11 0800) Resp:  [21-34] 23 (11/11 0800) BP: (82-144)/(45-108) 125/58 (11/11 0800) SpO2:  [97 %-100 %] 100 % (11/11 0800) FiO2 (%):  [40 %] 40 % (11/11 0859) Weight:  [77.2 kg (170 lb 3.1 oz)] 77.2 kg (170 lb 3.1 oz) (11/11 0400)  CBC:   Recent Labs Lab 07/25/16 0228  07/29/16 0635 07/30/16 0547  WBC 14.9*  < > 10.4 6.4  NEUTROABS 13.4*  --  9.2*  --   HGB 12.7*  < > 10.8* 10.0*  HCT 36.6*  < > 34.1* 32.1*  MCV 86.7  < > 92.9 92.8  PLT 112*  < > 101* 85*  < > = values in this interval not displayed.  Basic Metabolic Panel:   Recent Labs Lab 07/26/16 1544  07/28/16 0530  07/29/16 0635  07/29/16 2205 07/30/16 0547  NA  --   < > 160*  < > 161*  < > 160* 159*  K  --   < > 3.1*  < > 3.4*  --   --  3.4*  CL  --   < > 127*  < > >130*  --   --  127*  CO2  --   < > 24  < > 25  --   --  23  GLUCOSE  --   < > 224*  < > 135*  --   --  341*  BUN  --   < > 53*  < > 59*  --   --  65*  CREATININE  --   < > 1.45*  < > 1.48*  --   --  1.46*  CALCIUM  --   < > 8.8*  < > 8.5*  --   --  8.6*  MG 2.2  --  2.1  --   --   --   --   --   PHOS 2.6  --  2.2*  --   --   --   --   --   < > = values in this interval not displayed.  Lipid Panel:     Component Value Date/Time   CHOL 161 07/25/2016 0228   TRIG 95 07/25/2016 0228   HDL 36 (L) 07/25/2016 0228   CHOLHDL 4.5 07/25/2016 0228   VLDL 19 07/25/2016 0228   LDLCALC 106 (H) 07/25/2016 0228   HgbA1c:  Lab Results  Component Value Date   HGBA1C 8.2 (H) 07/25/2016   Urine Drug Screen:      Component Value Date/Time   LABOPIA NONE DETECTED 08-05-2016 0918   COCAINSCRNUR NONE DETECTED 08/05/2016 0918   LABBENZ NONE DETECTED 08-05-16 0918   AMPHETMU NONE DETECTED August 05, 2016 0918   THCU NONE DETECTED 08/05/16 0918   LABBARB NONE DETECTED Aug 05, 2016 0918      IMAGING I have personally reviewed the radiological images below and agree with the radiology interpretations.  Ct Head Code Stroke W/o Cm 05-Aug-2016 1. Large acute intraparenchymal hematoma measuring 64 x 53 x 69 mm (estimated volume 115 cc) centered near the  right basal ganglia. Localized mass effect with up to 14 mm of right-to-left shift.  2. Associated intraventricular extension with blood in the lateral and third ventricles. Dilatation of the temporal horns of both lateral ventricles without hydrocephalus.  3. Probable subdural extension with tiny right subdural hematoma measuring up to 3 mm.  4. Underlying chronic microvascular ischemic disease.   CT Head WO Contrast - 07/24/16 1. Unchanged size of large right basal ganglia region parenchymal hemorrhage. Slightly increased edema without increased midline shift. 2. Increased intraventricular hemorrhage with mild interval left lateral ventricular dilatation concerning for developing Hydrocephalus.  Ct Head Wo Contrast 07/27/2016 IMPRESSION: Right basal ganglia and frontal parenchymal hemorrhage with intraventricular extension unchanged. Moderate hydrocephalus unchanged. 7 mm midline shift to the left unchanged.   07/29/2016 IMPRESSION: Right hemispheric hemorrhage unchanged. Intraventricular hemorrhage unchanged. 8 mm midline shift to the left unchanged. Mild hydrocephalus unchanged   Dg Chest Port 1 View 07/25/16  1. Tip of the left IJ catheter is in the projection of the SVC. 2. New right lung opacity, which may reflect asymmetric edema or pneumonia.   07/30/2016 1. Stable support apparatus. 2. Opacity in the right base is mildly more prominent.  Recommend follow-up to resolution.  TEE Left ventricle: The cavity size was normal. Wall thickness was   increased in a pattern of mild LVH. Systolic function was   vigorous. The estimated ejection fraction was in the range of 65%   to 70%. Wall motion was normal; there were no regional wall   motion abnormalities. Doppler parameters are consistent with   abnormal left ventricular relaxation (grade 1 diastolic   dysfunction). - Aortic valve: Trileaflet; mildly thickened leaflets. - Left atrium: The atrium was mildly dilated. Impressions: - No cardiac source of emboli was indentified.   PHYSICAL EXAM  Temp:  [97.6 F (36.4 C)-102.5 F (39.2 C)] 99.8 F (37.7 C) (11/11 0743) Pulse Rate:  [77-92] 79 (11/11 0800) Resp:  [21-34] 23 (11/11 0800) BP: (82-144)/(45-108) 125/58 (11/11 0800) SpO2:  [97 %-100 %] 100 % (11/11 0800) FiO2 (%):  [40 %] 40 % (11/11 0859) Weight:  [77.2 kg (170 lb 3.1 oz)] 77.2 kg (170 lb 3.1 oz) (11/11 0400)  General - Well nourished, well developed, intubated.  Ophthalmologic - Fundi not visualized due to noncooperation.  Cardiovascular - Regular rate and rhythm.  Neuro - intubated, eyes closed, no respond to voice. Not follow commands but resist forced eye opening. PERRL, not blink to visual threat bilaterally, positive corneal and gag. Left UE flaccid, BLE mild withdraw to pain stimulation. RUE localized to pain stimulation. DTR 1+ and no babinski. Sensation, coordination and gait not tested.    ASSESSMENT/PLAN [name] is a 65 y.o. 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 He did not receive IV t-PA due to ICH.   ICH: Large acute right intraparenchymal hematoma centered near the right basal ganglia, likely due to long-standing HTN and DM.  Resultant  Left hemiplegia, drowsiness  CT - Large acute intraparenchymal  hematoma.  Repeat CT stable hematoma but increased edema and developing hydrocephalus  CT repeat 07/27/16 no significant change  CT repeat 07/29/16 no significant change  Carotid Doppler - not ordered  2D Echo - EF 65-70%  LDL - 106  HgbA1c - 8.2  VTE prophylaxis - heparin subq Diet NPO time specified  aspirin 81 mg daily prior to admission, now on No antithrombotic  secondary to ICH.  Patient counseled to be compliant with his antithrombotic medications  Ongoing aggressive stroke risk factor management  Therapy recommendations:  pending  Disposition:  Pending  Cerebral edema  CT showed Large ICH with midline shift  CT repeat 07/27/16 and 07/29/16 no significant change  3% saline still on hold  Na Goal 150-160  Na 158->160->158->159->160->161 ->160 ->159  Respiratory failure  Intubated for airway protection  On vent  CCM on board  CXR - opacity right lung base  Hypertension  Stable  BP goal < 160  Off cleviprex  on lisinopril and amlodipine for BP control  on labetalol tp 200mg  tid for BP control  Hyperlipidemia  Home meds:  Fish oil and cinnamon prior to admission  LDL 106 goal < 70  Hold off statin at this time due to large ICH  Diabetes  HgbA1c 8.2, goal < 7.0  Uncontrolled  On insulin drip  SSI  Dysphagia   Did not pass swallow  On TF for nutrition  Fever   CXR - opacity right lung base  Sputum culture showed Staph. Auras  UA negative by  Blood culture NGTD  On Fortaz and vanco now  Other Stroke Risk Factors  Advanced age  Hx of ICH 15 years ago without residue  Other Active Problems  Thrombocytopenia 112->107->100->91->101 -> 85  Anemia - 10.0 / 32.1  Hypokalemia - supplement - 3.4  Renal insufficiency - BUN - 65; creatinine - 1.46 (repeat Bmet tomorrow)    Hospital day # 7  This patient is critically ill due to large right hemisphere ICH, hypertension, uncontrolled diabetes, central fever and at  significant risk of neurological worsening, death form hematoma expansion, cerebral edema, brain herniation, DKA, hypertensive emergency. This patient's care requires constant monitoring of vital signs, hemodynamics, respiratory and cardiac monitoring, review of multiple databases, neurological assessment, discussion with family, other specialists and medical decision making of high complexity. I spent 30 minutes of neurocritical care time in the care of this patient.  Marvel PlanJindong Shakerria Parran, MD PhD Stroke Neurology 07/30/2016 11:11 AM

## 2016-07-30 NOTE — Progress Notes (Signed)
[facility] P[facility]M  I have seen and examined the patient with nurse practitioner/resident and agree with the note above.  We formulated the plan together and I elicited the following history.    65 y/o male with hypertensive bleed leading to acute encephalopathy due to inability to protect his airway.  Remains intubated Hyperglycemia  On exam Vitals:   07/30/16 1300 07/30/16 1400 07/30/16 1427 07/30/16 1500  BP: (!) 120/59 (!) 103/55 (!) 103/55 (!) 111/58  Pulse: 88 90 92 90  Resp: (!) 31 (!) 32 (!) 36 (!) 28  Temp:      TempSrc:      SpO2: 100% 99%  99%  Weight:      Height:       Vent Mode: PRVC FiO2 (%):  [40 %] 40 % Set Rate:  [14 bmp] 14 bmp Vt Set:  [620 mL] 620 mL PEEP:  [5 cmH20] 5 cmH20 Pressure Support:  [5 cmH20] 5 cmH20 Plateau Pressure:  [14 cmH20-16 cmH20] 16 cmH20   Gen: sedated on vent HENT: NCAT, ETT in place PULM: rhonchi bilaterally, normal ETT CV: RRR, no mgr GI: BS+, soft, nontender Derm: no cyanosis or rash Neuro: normal mood and affect  Impression/Plan Acute respiratory failure with hypoxemia> extubate when mental status improves ICH> care per neurology Aspiration pneumonia> continue antibiotics Fever> APAP prn, normothermia pads? If Stroke service desires  My cc time 32 minutes  Heber CarolinaBrent McQuaid, MD [place] [facility] Pager: [phone] Cell: [phone] After 3pm or if no response, call [phone]

## 2016-07-31 ENCOUNTER — Inpatient Hospital Stay (HOSPITAL_COMMUNITY): Payer: Medicare Other

## 2016-07-31 DIAGNOSIS — I161 Hypertensive emergency: Secondary | ICD-10-CM

## 2016-07-31 LAB — SODIUM
SODIUM: 162 mmol/L — AB (ref 135–145)
SODIUM: 159 mmol/L — AB (ref 135–145)
SODIUM: 158 mmol/L — AB (ref 135–145)
Sodium: 162 mmol/L (ref 135–145)

## 2016-07-31 LAB — BASIC METABOLIC PANEL
ANION GAP: 9 (ref 5–15)
ANION GAP: 9 (ref 5–15)
BUN: 58 mg/dL — ABNORMAL HIGH (ref 6–20)
BUN: 57 mg/dL — ABNORMAL HIGH (ref 6–20)
CHLORIDE: 130 mmol/L — AB (ref 101–111)
CO2: 21 mmol/L — AB (ref 22–32)
CO2: 23 mmol/L (ref 22–32)
Calcium: 8.4 mg/dL — ABNORMAL LOW (ref 8.9–10.3)
Calcium: 8.7 mg/dL — ABNORMAL LOW (ref 8.9–10.3)
Chloride: 129 mmol/L — ABNORMAL HIGH (ref 101–111)
Creatinine, Ser: 1.43 mg/dL — ABNORMAL HIGH (ref 0.61–1.24)
Creatinine, Ser: 1.32 mg/dL — ABNORMAL HIGH (ref 0.61–1.24)
GFR calc Af Amer: 57 mL/min — ABNORMAL LOW (ref >60)
GFR calc non Af Amer: 49 mL/min — ABNORMAL LOW (ref >60)
GFR, EST NON AFRICAN AMERICAN: 54 mL/min — AB (ref >60)
GLUCOSE: 212 mg/dL — AB (ref 65–99)
GLUCOSE: 153 mg/dL — AB (ref 65–99)
POTASSIUM: 3.8 mmol/L (ref 3.5–5.1)
POTASSIUM: 3.4 mmol/L — AB (ref 3.5–5.1)
Sodium: 160 mmol/L — ABNORMAL HIGH (ref 135–145)
Sodium: 161 mmol/L (ref 135–145)

## 2016-07-31 LAB — GLUCOSE, CAPILLARY
GLUCOSE-CAPILLARY: 163 mg/dL — AB (ref 65–99)
GLUCOSE-CAPILLARY: 135 mg/dL — AB (ref 65–99)
GLUCOSE-CAPILLARY: 153 mg/dL — AB (ref 65–99)
GLUCOSE-CAPILLARY: 160 mg/dL — AB (ref 65–99)
GLUCOSE-CAPILLARY: 144 mg/dL — AB (ref 65–99)
GLUCOSE-CAPILLARY: 173 mg/dL — AB (ref 65–99)
GLUCOSE-CAPILLARY: 154 mg/dL — AB (ref 65–99)
GLUCOSE-CAPILLARY: 167 mg/dL — AB (ref 65–99)
GLUCOSE-CAPILLARY: 168 mg/dL — AB (ref 65–99)
GLUCOSE-CAPILLARY: 153 mg/dL — AB (ref 65–99)
Glucose-Capillary: 130 mg/dL — ABNORMAL HIGH (ref 65–99)
Glucose-Capillary: 151 mg/dL — ABNORMAL HIGH (ref 65–99)
Glucose-Capillary: 157 mg/dL — ABNORMAL HIGH (ref 65–99)
Glucose-Capillary: 159 mg/dL — ABNORMAL HIGH (ref 65–99)
Glucose-Capillary: 166 mg/dL — ABNORMAL HIGH (ref 65–99)
Glucose-Capillary: 170 mg/dL — ABNORMAL HIGH (ref 65–99)
Glucose-Capillary: 171 mg/dL — ABNORMAL HIGH (ref 65–99)
Glucose-Capillary: 177 mg/dL — ABNORMAL HIGH (ref 65–99)
Glucose-Capillary: 182 mg/dL — ABNORMAL HIGH (ref 65–99)
Glucose-Capillary: 183 mg/dL — ABNORMAL HIGH (ref 65–99)
Glucose-Capillary: 191 mg/dL — ABNORMAL HIGH (ref 65–99)
Glucose-Capillary: 194 mg/dL — ABNORMAL HIGH (ref 65–99)
Glucose-Capillary: 205 mg/dL — ABNORMAL HIGH (ref 65–99)

## 2016-07-31 MED ORDER — FREE WATER
200.0000 mL | Status: DC
  Administered 2016-07-31 – 2016-08-08 (×49): 200 mL

## 2016-07-31 NOTE — Progress Notes (Signed)
[facility]  I have seen and examined the patient with nurse practitioner/resident and agree with the note above.  We formulated the plan together and I elicited the following history.    [name] is a bit more awake today  On exam Fever overnight Lungs with a few crackles left base, vent supported breathing with normal effort from patient CV: RRR, no mgr Belly soft, nontender  CXR: personally reviewed, ? r lung atelectasis, otherwise clear  Labs reviewed, Na 160, Cr 1.4  Acute respiratory failure> continue pressure support throughout day, mental status barrier to extubation, hold sedation; consider early tracheostomy this week if neurology feels appropriate given prognosis. Fever> presumably related to stroke? Don't see much pneumonia.  Defer temperature management strategy to primary service, continue APAP Intracerebral hemorrhage> per primary service  Family updated by me  My cc time 35 minutes  Seth CarolinaBrent Krystalyn Kubota, MD [place] [facility] Pager: [phone] Cell: [phone] After 3pm or if no response, call [phone]

## 2016-07-31 NOTE — Progress Notes (Signed)
PULMONARY / CRITICAL CARE MEDICINE   Name: Seth Potter MRN: [id number] DOB: 1951/02/08    ADMISSION DATE:  08/10/2016 CONSULTATION DATE:  07/27/2016  REFERRING MD:  Dr. Roda Potter, neurology.  CHIEF COMPLAINT:  Acute respiratory failure, unable to protect airway.  HISTORY OF PRESENT ILLNESS:   65 year old male with an acute hypertensive bleed who presents with AMS and inability to protect his airway.  PCCM consulted for intubation and medical management.  SUBJECTIVE:  RN reported elevated Na - free water added per Neuro overnight.    VITAL SIGNS: BP 126/62   Pulse 92   Temp 99.5 F (37.5 C) (Axillary)   Resp (!) 28   Ht 6' (1.829 m) Comment: per wife  Wt 168 lb 14 oz (76.6 kg)   SpO2 100%   BMI 22.90 kg/m   VENTILATOR SETTINGS: Vent Mode: PSV FiO2 (%):  [40 %] 40 % Set Rate:  [14 bmp] 14 bmp Vt Set:  [620 mL] 620 mL PEEP:  [5 cmH20] 5 cmH20 Pressure Support:  [5 cmH20-10 cmH20] 10 cmH20 Plateau Pressure:  [15 cmH20-16 cmH20] 16 cmH20  INTAKE / OUTPUT: I/O last 3 completed shifts: In: 3240.4 [I.V.:150.4; NG/GT:2540; IV Piggyback:550] Out: 4090 [Urine:3290; Stool:800]  PHYSICAL EXAMINATION: General: critically ill appearing male in NAD Neuro: eyes slightly open, more active in bed, spontaneous movement of RUE, RLE, LLE HEENT: ETT in place, small bore feeding tube Cardiovascular: regular, no murmur Lungs: non-labored, coarse bilaterally   Abdomen:  Soft, non tender Musculoskeletal: no edema Skin: no rashes  LABS:  BMET  Recent Labs Lab 07/29/16 0635  07/30/16 0547  07/30/16 1823 07/30/16 2332 07/31/16 0836  NA 161*  < > 159*  < > 162* 162* 160*  K 3.4*  --  3.4*  --   --   --  3.8  CL >130*  --  127*  --   --   --  130*  CO2 25  --  23  --   --   --  21*  BUN 59*  --  65*  --   --   --  58*  CREATININE 1.48*  --  1.46*  --   --   --  1.43*  GLUCOSE 135*  --  341*  --   --   --  212*  < > = values in this interval not displayed.  Electrolytes  Recent  Labs Lab 07/26/16 0510 07/26/16 1544  07/28/16 0530  07/29/16 0635 07/30/16 0547 07/31/16 0836  CALCIUM 9.0  --   < > 8.8*  < > 8.5* 8.6* 8.4*  MG 2.0 2.2  --  2.1  --   --   --   --   PHOS 1.9* 2.6  --  2.2*  --   --   --   --   < > = values in this interval not displayed.  CBC  Recent Labs Lab 07/28/16 0530 07/29/16 0635 07/30/16 0547  WBC 9.5 10.4 6.4  HGB 11.3* 10.8* 10.0*  HCT 35.0* 34.1* 32.1*  PLT 91* 101* 85*    Coag's No results for input(s): APTT, INR in the last 168 hours.  Sepsis Markers No results for input(s): LATICACIDVEN, PROCALCITON, O2SATVEN in the last 168 hours.  ABG  Recent Labs Lab 07/27/16 0555 07/27/16 1308 07/28/16 0319  PHART 7.527* 7.500* 7.522*  PCO2ART 27.5* 33.7 27.5*  PO2ART 65.0* 134* 94.9    Liver Enzymes No results for input(s): AST, ALT, ALKPHOS, BILITOT, ALBUMIN in  the last 168 hours.  Cardiac Enzymes  Recent Labs Lab 07/27/16 0615  TROPONINI 0.11*    Glucose  Recent Labs Lab 07/31/16 0512 07/31/16 0604 07/31/16 0707 07/31/16 0810 07/31/16 0906 07/31/16 1005  GLUCAP 135* 153* 160* 205* 182* 177*    Imaging Dg Chest Port 1 View  Result Date: 07/31/2016 CLINICAL DATA:  Acute respiratory failure EXAM: PORTABLE CHEST 1 VIEW COMPARISON:  July 30, 2016 FINDINGS: The feeding tube terminates below today's film. No pneumothorax. The ET tube is in good position. Mild opacity in the right lung base is similar in the interval. No other interval changes or acute abnormalities. IMPRESSION: Stable support apparatus. Mild opacity in the right base, similar in the interval. Recommend follow-up to resolution. Electronically Signed   By: Seth Potter  Seth Potter M.D   On: 07/31/2016 07:20     STUDIES:  11/7 ECHO >> mild LVH, EF 65 to 70%, grade 1 DD 11/8 CT Head >> increased hemorrhage and midline shift.  CULTURES: 11/5 BC >> negative  11/5 urine >> negative 11/8 BC >> 11/8 Sputum >> MSSA 11/11 C-diff >>  negative  ANTIBIOTICS: Vancomycin 11/09 >> 11/11 Elita QuickFortaz 11/09 >> 11/11 Ancef 11/11 >>   SIGNIFICANT EVENTS: 11/04  Admit with AMS, acute hypertensive bleed 11/08  intubation for airway protection  LINES/TUBES: Lt IJ CVL 11/06 >> 11/11 ETT 11/8 >>  DISCUSSION: 65 year old male with hypertensive ICH now with increased bleeding and midline shift.   ASSESSMENT / PLAN:  Acute hypoxic respiratory failure with inability to protect airway in setting of ICH and aspiration pneumonia. - PRVC 8 cc/kg - wean PEEP / FiO2 for sats >92% - daily SBT as able > vent mechanics on 5/5 look good, mental status barrier to extubation - f/u CXR - day 4 of Abx  ICH with cerebral edema. - completed induced hypernatremia per neurology 11/5  HTN emergency. Hx of HLD. - goal SBP < 160 - continue norvasc, labetalol  Hypokalemia  Hypernatremia - trend BMP / UOP  - replace electrolytes as indicated  - free water added per Neuro   Thrombocytopenia  - monitor CBC, evidence of bleeding  Nutrition. - tube feeding per nutrition  DM type II. - insulin gtt  BPH  -continue flomax, verified with pharmacy can be opened/crushed, females should not touch / wear gloves when handling -I/O cath as needed   DVT prophylaxis - SCDs SUP - protonix Goals of care - Full code   CC Time: 30 minutes   [name], NP-C [facility] Pgr: [phone] or if no answer [phone] 07/31/2016, 10:54 AM

## 2016-07-31 NOTE — Progress Notes (Signed)
STROKE TEAM PROGRESS NOTE   SUBJECTIVE (INTERVAL HISTORY) No family is at the bedside. Na 162 overnight and put on free water. Neuro stable overnight. He still intubated and no sedation. BP stable but glucose still high. Still on insulin drip for hyperglycemia. Still febrile and on ancef now. Diarrhea better and C diff negative.   OBJECTIVE Temp:  [99 F (37.2 C)-101.9 F (38.8 C)] 99.5 F (37.5 C) (11/12 0900) Pulse Rate:  [79-94] 92 (11/12 0900) Cardiac Rhythm: Normal sinus rhythm (11/11 2000) Resp:  [19-36] 28 (11/12 0900) BP: (103-131)/(50-64) 126/62 (11/12 0900) SpO2:  [98 %-100 %] 100 % (11/12 0929) FiO2 (%):  [40 %] 40 % (11/12 0935) Weight:  [76.6 kg (168 lb 14 oz)] 76.6 kg (168 lb 14 oz) (11/12 0357)  CBC:   Recent Labs Lab 07/25/16 0228  07/29/16 0635 07/30/16 0547  WBC 14.9*  < > 10.4 6.4  NEUTROABS 13.4*  --  9.2*  --   HGB 12.7*  < > 10.8* 10.0*  HCT 36.6*  < > 34.1* 32.1*  MCV 86.7  < > 92.9 92.8  PLT 112*  < > 101* 85*  < > = values in this interval not displayed.  Basic Metabolic Panel:   Recent Labs Lab 07/26/16 1544  07/28/16 0530  07/30/16 0547  07/30/16 2332 07/31/16 0836  NA  --   < > 160*  < > 159*  < > 162* 160*  K  --   < > 3.1*  < > 3.4*  --   --  3.8  CL  --   < > 127*  < > 127*  --   --  130*  CO2  --   < > 24  < > 23  --   --  21*  GLUCOSE  --   < > 224*  < > 341*  --   --  212*  BUN  --   < > 53*  < > 65*  --   --  58*  CREATININE  --   < > 1.45*  < > 1.46*  --   --  1.43*  CALCIUM  --   < > 8.8*  < > 8.6*  --   --  8.4*  MG 2.2  --  2.1  --   --   --   --   --   PHOS 2.6  --  2.2*  --   --   --   --   --   < > = values in this interval not displayed.  Lipid Panel:     Component Value Date/Time   CHOL 161 07/25/2016 0228   TRIG 95 07/25/2016 0228   HDL 36 (L) 07/25/2016 0228   CHOLHDL 4.5 07/25/2016 0228   VLDL 19 07/25/2016 0228   LDLCALC 106 (H) 07/25/2016 0228   HgbA1c:  Lab Results  Component Value Date   HGBA1C 8.2  (H) 07/25/2016   Urine Drug Screen:     Component Value Date/Time   LABOPIA NONE DETECTED 07/25/2016 0918   COCAINSCRNUR NONE DETECTED 08/14/2016 0918   LABBENZ NONE DETECTED 08/10/2016 0918   AMPHETMU NONE DETECTED 07/28/2016 0918   THCU NONE DETECTED 08/10/2016 0918   LABBARB NONE DETECTED 08/13/2016 0918      IMAGING I have personally reviewed the radiological images below and agree with the radiology interpretations.  Ct Head Code Stroke W/o Cm 07/24/2016 1. Large acute intraparenchymal hematoma measuring 64 x 53 x 69 mm (  estimated volume 115 cc) centered near the right basal ganglia. Localized mass effect with up to 14 mm of right-to-left shift.  2. Associated intraventricular extension with blood in the lateral and third ventricles. Dilatation of the temporal horns of both lateral ventricles without hydrocephalus.  3. Probable subdural extension with tiny right subdural hematoma measuring up to 3 mm.  4. Underlying chronic microvascular ischemic disease.   CT Head WO Contrast - 07/24/16 1. Unchanged size of large right basal ganglia region parenchymal hemorrhage. Slightly increased edema without increased midline shift. 2. Increased intraventricular hemorrhage with mild interval left lateral ventricular dilatation concerning for developing Hydrocephalus.  Ct Head Wo Contrast 07/27/2016 IMPRESSION: Right basal ganglia and frontal parenchymal hemorrhage with intraventricular extension unchanged. Moderate hydrocephalus unchanged. 7 mm midline shift to the left unchanged.   07/29/2016 IMPRESSION: Right hemispheric hemorrhage unchanged. Intraventricular hemorrhage unchanged. 8 mm midline shift to the left unchanged. Mild hydrocephalus unchanged   Dg Chest Port 1 View 07/25/16  1. Tip of the left IJ catheter is in the projection of the SVC. 2. New right lung opacity, which may reflect asymmetric edema or pneumonia.   07/30/2016 1. Stable support apparatus. 2. Opacity in the  right base is mildly more prominent. Recommend follow-up to resolution.  07/31/2016 Stable support apparatus. Mild opacity in the right base, similar in the interval.  Recommend follow-up to resolution.  TEE Left ventricle: The cavity size was normal. Wall thickness was   increased in a pattern of mild LVH. Systolic function was   vigorous. The estimated ejection fraction was in the range of 65%   to 70%. Wall motion was normal; there were no regional wall   motion abnormalities. Doppler parameters are consistent with   abnormal left ventricular relaxation (grade 1 diastolic   dysfunction). - Aortic valve: Trileaflet; mildly thickened leaflets. - Left atrium: The atrium was mildly dilated. Impressions: - No cardiac source of emboli was indentified.   PHYSICAL EXAM  Temp:  [99 F (37.2 C)-101.9 F (38.8 C)] 99.5 F (37.5 C) (11/12 0900) Pulse Rate:  [79-94] 92 (11/12 0900) Resp:  [19-36] 28 (11/12 0900) BP: (103-131)/(50-64) 126/62 (11/12 0900) SpO2:  [98 %-100 %] 100 % (11/12 0929) FiO2 (%):  [40 %] 40 % (11/12 0935) Weight:  [76.6 kg (168 lb 14 oz)] 76.6 kg (168 lb 14 oz) (11/12 0357)  General - Well nourished, well developed, intubated.  Ophthalmologic - Fundi not visualized due to noncooperation.  Cardiovascular - Regular rate and rhythm.  Neuro - intubated, eyes closed, no respond to voice. Not follow commands but resist forced eye opening. PERRL, not blink to visual threat bilaterally, positive corneal and gag. Left UE flaccid, BLE mild withdraw to pain stimulation. RUE localized to pain stimulation. DTR 1+ and no babinski. Sensation, coordination and gait not tested.    ASSESSMENT/PLAN [name] is a 65 y.o. 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 He did not receive IV t-PA due to ICH.   ICH: Large acute right intraparenchymal hematoma  centered near the right basal ganglia, likely due to long-standing HTN and DM.  Resultant  Left hemiplegia, drowsiness  CT - Large acute intraparenchymal hematoma.  Repeat CT stable hematoma but increased edema and developing hydrocephalus  CT repeat 07/27/16 no significant change  CT repeat 07/29/16 no significant change  Carotid Doppler - not ordered  2D Echo - EF 65-70%  LDL -  106  HgbA1c - 8.2  VTE prophylaxis - heparin subq Diet NPO time specified  aspirin 81 mg daily prior to admission, now on No antithrombotic secondary to ICH.  Patient counseled to be compliant with his antithrombotic medications  Ongoing aggressive stroke risk factor management  Therapy recommendations:  pending  Disposition:  Pending  Cerebral edema  CT showed Large ICH with midline shift  CT repeat 07/27/16 and 07/29/16 no significant change  Off 3% saline   Central line d/c'ed  Na Goal 150-160  Na 158->160->158->159->160->161 ->160 ->159->160->162  On free water now  Respiratory failure  Intubated for airway protection  On vent  CCM on board  CXR - opacity right lung base  Fever - could be central fever vs pneumonia  CXR - opacity right lung base  Sputum culture showed Staph. Auras - pan sensitive  UA negative   Blood culture NGTD  On Ancef now  Diarrhea   Likely due toTF  C diff negative  On rectal bag  Hypertension  Stable  BP goal < 160  Off cleviprex  on lisinopril and amlodipine for BP control  on labetalol tp 200mg  tid for BP control  Hyperlipidemia  Home meds:  Fish oil and cinnamon prior to admission  LDL 106 goal < 70  Hold off statin at this time due to large ICH  Diabetes  HgbA1c 8.2, goal < 7.0  Uncontrolled  On insulin drip  SSI  Dysphagia   Did not pass swallow  On TF for nutrition  Other Stroke Risk Factors  Advanced age  Hx of ICH 15 years ago without residue  Other Active Problems  Thrombocytopenia  112->107->100->91->101 -> 85 (CBC Monday)  Anemia - 10.0 / 32.1  Hypokalemia - supplement - 3.4 ->3.8  Renal insufficiency - creatinine - 1.46 -> 1.43  Hospital day # 8  This patient is critically ill due to large right hemisphere ICH, hypertension, uncontrolled diabetes, central fever and at significant risk of neurological worsening, death form hematoma expansion, cerebral edema, brain herniation, DKA, hypertensive emergency. This patient's care requires constant monitoring of vital signs, hemodynamics, respiratory and cardiac monitoring, review of multiple databases, neurological assessment, discussion with family, other specialists and medical decision making of high complexity. I spent 30 minutes of neurocritical care time in the care of this patient.  Marvel PlanJindong Lakaisha Danish, MD PhD Stroke Neurology 07/31/2016 2:58 PM

## 2016-08-01 ENCOUNTER — Inpatient Hospital Stay (HOSPITAL_COMMUNITY): Payer: Medicare Other

## 2016-08-01 DIAGNOSIS — G936 Cerebral edema: Secondary | ICD-10-CM

## 2016-08-01 LAB — GLUCOSE, CAPILLARY
GLUCOSE-CAPILLARY: 171 mg/dL — AB (ref 65–99)
GLUCOSE-CAPILLARY: 147 mg/dL — AB (ref 65–99)
GLUCOSE-CAPILLARY: 168 mg/dL — AB (ref 65–99)
GLUCOSE-CAPILLARY: 182 mg/dL — AB (ref 65–99)
GLUCOSE-CAPILLARY: 184 mg/dL — AB (ref 65–99)
GLUCOSE-CAPILLARY: 172 mg/dL — AB (ref 65–99)
GLUCOSE-CAPILLARY: 177 mg/dL — AB (ref 65–99)
GLUCOSE-CAPILLARY: 170 mg/dL — AB (ref 65–99)
GLUCOSE-CAPILLARY: 126 mg/dL — AB (ref 65–99)
GLUCOSE-CAPILLARY: 179 mg/dL — AB (ref 65–99)
GLUCOSE-CAPILLARY: 182 mg/dL — AB (ref 65–99)
GLUCOSE-CAPILLARY: 190 mg/dL — AB (ref 65–99)
GLUCOSE-CAPILLARY: 194 mg/dL — AB (ref 65–99)
GLUCOSE-CAPILLARY: 166 mg/dL — AB (ref 65–99)
Glucose-Capillary: 134 mg/dL — ABNORMAL HIGH (ref 65–99)
Glucose-Capillary: 137 mg/dL — ABNORMAL HIGH (ref 65–99)
Glucose-Capillary: 150 mg/dL — ABNORMAL HIGH (ref 65–99)
Glucose-Capillary: 153 mg/dL — ABNORMAL HIGH (ref 65–99)
Glucose-Capillary: 154 mg/dL — ABNORMAL HIGH (ref 65–99)
Glucose-Capillary: 162 mg/dL — ABNORMAL HIGH (ref 65–99)
Glucose-Capillary: 165 mg/dL — ABNORMAL HIGH (ref 65–99)
Glucose-Capillary: 173 mg/dL — ABNORMAL HIGH (ref 65–99)
Glucose-Capillary: 192 mg/dL — ABNORMAL HIGH (ref 65–99)

## 2016-08-01 LAB — CULTURE, BLOOD (ROUTINE X 2)
CULTURE: NO GROWTH
Culture: NO GROWTH

## 2016-08-01 LAB — SODIUM: Sodium: 159 mmol/L — ABNORMAL HIGH (ref 135–145)

## 2016-08-01 NOTE — Progress Notes (Signed)
RT note- wean stopped due increased VE and RR, RN aware.

## 2016-08-01 NOTE — Care Management Important Message (Signed)
Important Message  Patient Details  Name: Seth PerfectRichard M Potter MRN: [id number] Date of Birth: 25-Sep-1950   Medicare Important Message Given:  Other (see comment)    [name] 08/01/2016, 10:34 AM

## 2016-08-01 NOTE — Progress Notes (Signed)
PULMONARY / CRITICAL CARE MEDICINE   Name: Seth Potter MRN: [id number] DOB: [birth year]    ADMISSION DATE:  08/12/2016 CONSULTATION DATE:  07/27/2016  REFERRING MD:  Dr. Roda ShuttersXu, neurology.  CHIEF COMPLAINT:  Acute respiratory failure, unable to protect airway.  HISTORY OF PRESENT ILLNESS:   65 year old male with an acute hypertensive bleed who presents with AMS and inability to protect his airway.  PCCM consulted for intubation and medical management.  SUBJECTIVE:  No events overnight, weaning but remains unresponsive  VITAL SIGNS: BP 122/62   Pulse 81   Temp 99.7 F (37.6 C) (Axillary)   Resp (!) 24   Ht 6' (1.829 m) Comment: per wife  Wt 76.3 kg (168 lb 3.4 oz)   SpO2 100%   BMI 22.81 kg/m   VENTILATOR SETTINGS: Vent Mode: PRVC FiO2 (%):  [40 %] 40 % Set Rate:  [14 bmp] 14 bmp Vt Set:  [620 mL] 620 mL PEEP:  [5 cmH20] 5 cmH20 Pressure Support:  [8 cmH20-10 cmH20] 8 cmH20 Plateau Pressure:  [15 cmH20-18 cmH20] 16 cmH20  INTAKE / OUTPUT: I/O last 3 completed shifts: In: 3950.9 [I.V.:135.9; NG/GT:3415; IV Piggyback:400] Out: 3970 [Urine:2770; Stool:1200]  PHYSICAL EXAMINATION: General: critically ill appearing male in NAD Neuro: not following commands, spontaneous movement of RUE, RLE, LLE HEENT: ETT in place, small bore feeding tube Cardiovascular: regular, no murmur Lungs: non-labored, coarse bilaterally   Abdomen:  Soft, non tender Musculoskeletal: no edema Skin: no rashes  LABS:  BMET  Recent Labs Lab 07/30/16 0547  07/31/16 0836  07/31/16 1548 07/31/16 1813 07/31/16 2326  NA 159*  < > 160*  < > 161* 159* 158*  K 3.4*  --  3.8  --  3.4*  --   --   CL 127*  --  130*  --  129*  --   --   CO2 23  --  21*  --  23  --   --   BUN 65*  --  58*  --  57*  --   --   CREATININE 1.46*  --  1.43*  --  1.32*  --   --   GLUCOSE 341*  --  212*  --  153*  --   --   < > = values in this interval not displayed.  Electrolytes  Recent Labs Lab 07/26/16 0510  07/26/16 1544  07/28/16 0530  07/30/16 0547 07/31/16 0836 07/31/16 1548  CALCIUM 9.0  --   < > 8.8*  < > 8.6* 8.4* 8.7*  MG 2.0 2.2  --  2.1  --   --   --   --   PHOS 1.9* 2.6  --  2.2*  --   --   --   --   < > = values in this interval not displayed.  CBC  Recent Labs Lab 07/28/16 0530 07/29/16 0635 07/30/16 0547  WBC 9.5 10.4 6.4  HGB 11.3* 10.8* 10.0*  HCT 35.0* 34.1* 32.1*  PLT 91* 101* 85*    Coag's No results for input(s): APTT, INR in the last 168 hours.  Sepsis Markers No results for input(s): LATICACIDVEN, PROCALCITON, O2SATVEN in the last 168 hours.  ABG  Recent Labs Lab 07/27/16 0555 07/27/16 1308 07/28/16 0319  PHART 7.527* 7.500* 7.522*  PCO2ART 27.5* 33.7 27.5*  PO2ART 65.0* 134* 94.9    Liver Enzymes No results for input(s): AST, ALT, ALKPHOS, BILITOT, ALBUMIN in the last 168 hours.  Cardiac Enzymes  Recent Labs  Lab 07/27/16 0615  TROPONINI 0.11*   Glucose  Recent Labs Lab 08/01/16 0205 08/01/16 0303 08/01/16 0408 08/01/16 0511 08/01/16 0601 08/01/16 0722  GLUCAP 173* 147* 137* 150* 154* 168*   Imaging Dg Chest Port 1 View  Result Date: 08/01/2016 CLINICAL DATA:  Hypoxia EXAM: PORTABLE CHEST 1 VIEW COMPARISON:  July 31, 2016 FINDINGS: Endotracheal tube tip is 4.3 cm above the carina. Feeding tube tip is below the diaphragm. No pneumothorax. There is no edema or consolidation. Heart size and pulmonary vascularity are normal. No adenopathy. There is atherosclerotic calcification in the aorta. No bone lesions evident. IMPRESSION: Tube positions as described without pneumothorax. No edema or consolidation. There is aortic atherosclerosis. Electronically Signed   By: Bretta BangWilliam  Woodruff III M.D.   On: 08/01/2016 07:20   STUDIES:  11/7 ECHO >> mild LVH, EF 65 to 70%, grade 1 DD 11/8 CT Head >> increased hemorrhage and midline shift.  CULTURES: 11/5 BC >> negative  11/5 urine >> negative 11/8 BC >> 11/8 Sputum >> MSSA 11/11  C-diff >> negative  ANTIBIOTICS: Vancomycin 11/09 >> 11/11 Elita QuickFortaz 11/09 >> 11/11 Ancef 11/11 >>   SIGNIFICANT EVENTS: 11/04  Admit with AMS, acute hypertensive bleed 11/08  intubation for airway protection  LINES/TUBES: Lt IJ CVL 11/06 >> 11/11 ETT 11/8 >>  DISCUSSION: 65 year old male with hypertensive ICH now with increased bleeding and midline shift.   ASSESSMENT / PLAN:  Acute hypoxic respiratory failure with inability to protect airway in setting of ICH and aspiration pneumonia. - PRVC 8 cc/kg - Wean PEEP / FiO2 for sats >92% - PS trials but no extubation given mental status - Need to decide on trach, need family meeting with neurology, currently not extubatable. - F/u CXR - Day 5 of Abx  ICH with cerebral edema. - Completed induced hypernatremia per neurology 11/5 - Defer Na management to neuro at this point.  HTN emergency. Hx of HLD. - Goal SBP < 160 - Continue norvasc, labetalol  Hypokalemia  Hypernatremia - Trend BMP / UOP  - Replace electrolytes as indicated  - Free water added per Neuro   Thrombocytopenia  - Monitor CBC, evidence of bleeding  Nutrition. - Tube feeding per nutrition  DM type II. - Insulin gtt  BPH  -Continue flomax, verified with pharmacy can be opened/crushed, females should not touch / wear gloves when handling - I/O cath as needed   DVT prophylaxis - SCDs SUP - Protonix Goals of care - Full code  The patient is critically ill with multiple organ systems failure and requires high complexity decision making for assessment and support, frequent evaluation and titration of therapies, application of advanced monitoring technologies and extensive interpretation of multiple databases.   Critical Care Time devoted to patient care services described in this note is  35  Minutes. This time reflects time of care of this signee Dr Koren BoundWesam Yacoub. This critical care time does not reflect procedure time, or teaching time or supervisory time  of PA/NP/Med student/Med Resident etc but could involve care discussion time.  Alyson ReedyWesam G. Yacoub, M.D. [facility]. Pager: [phone]. After hours pager: [phone].

## 2016-08-01 NOTE — Progress Notes (Signed)
STROKE TEAM PROGRESS NOTE   SUBJECTIVE (INTERVAL HISTORY) No family is at the bedside. Temp 101.5 this am.  Na 159 this AM. Not on 3% saline. Had insulin gtt. Not following any commands.   OBJECTIVE Temp:  [99 F (37.2 C)-101.9 F (38.8 C)] 101.5 F (38.6 C) (11/13 0800) Pulse Rate:  [79-95] 92 (11/13 1000) Cardiac Rhythm: Normal sinus rhythm (11/12 2000) Resp:  [19-32] 28 (11/13 1000) BP: (108-142)/(54-65) 128/58 (11/13 1000) SpO2:  [100 %] 100 % (11/13 1000) FiO2 (%):  [40 %] 40 % (11/13 0851) Weight:  [168 lb 3.4 oz (76.3 kg)] 168 lb 3.4 oz (76.3 kg) (11/13 0500)  CBC:   Recent Labs Lab 07/29/16 0635 07/30/16 0547  WBC 10.4 6.4  NEUTROABS 9.2*  --   HGB 10.8* 10.0*  HCT 34.1* 32.1*  MCV 92.9 92.8  PLT 101* 85*    Basic Metabolic Panel:   Recent Labs Lab 07/26/16 1544  07/28/16 0530  07/31/16 0836  07/31/16 1548  07/31/16 2326 08/01/16 0706  NA  --   < > 160*  < > 160*  < > 161*  < > 158* 159*  K  --   < > 3.1*  < > 3.8  --  3.4*  --   --   --   CL  --   < > 127*  < > 130*  --  129*  --   --   --   CO2  --   < > 24  < > 21*  --  23  --   --   --   GLUCOSE  --   < > 224*  < > 212*  --  153*  --   --   --   BUN  --   < > 53*  < > 58*  --  57*  --   --   --   CREATININE  --   < > 1.45*  < > 1.43*  --  1.32*  --   --   --   CALCIUM  --   < > 8.8*  < > 8.4*  --  8.7*  --   --   --   MG 2.2  --  2.1  --   --   --   --   --   --   --   PHOS 2.6  --  2.2*  --   --   --   --   --   --   --   < > = values in this interval not displayed.  Lipid Panel:     Component Value Date/Time   CHOL 161 07/25/2016 0228   TRIG 95 07/25/2016 0228   HDL 36 (L) 07/25/2016 0228   CHOLHDL 4.5 07/25/2016 0228   VLDL 19 07/25/2016 0228   LDLCALC 106 (H) 07/25/2016 0228   HgbA1c:  Lab Results  Component Value Date   HGBA1C 8.2 (H) 07/25/2016   Urine Drug Screen:     Component Value Date/Time   LABOPIA NONE DETECTED 01/30/16 0918   COCAINSCRNUR NONE DETECTED 01/30/16  0918   LABBENZ NONE DETECTED 01/30/16 0918   AMPHETMU NONE DETECTED 01/30/16 0918   THCU NONE DETECTED 01/30/16 0918   LABBARB NONE DETECTED 01/30/16 0918      IMAGING I have personally reviewed the radiological images below and agree with the radiology interpretations.  Ct Head Code Stroke W/o Cm 07/26/2016 1. Large acute intraparenchymal hematoma measuring 64 x  53 x 69 mm (estimated volume 115 cc) centered near the right basal ganglia. Localized mass effect with up to 14 mm of right-to-left shift.  2. Associated intraventricular extension with blood in the lateral and third ventricles. Dilatation of the temporal horns of both lateral ventricles without hydrocephalus.  3. Probable subdural extension with tiny right subdural hematoma measuring up to 3 mm.  4. Underlying chronic microvascular ischemic disease.   CT Head WO Contrast - 07/24/16 1. Unchanged size of large right basal ganglia region parenchymal hemorrhage. Slightly increased edema without increased midline shift. 2. Increased intraventricular hemorrhage with mild interval left lateral ventricular dilatation concerning for developing Hydrocephalus.  Ct Head Wo Contrast 07/27/2016 IMPRESSION: Right basal ganglia and frontal parenchymal hemorrhage with intraventricular extension unchanged. Moderate hydrocephalus unchanged. 7 mm midline shift to the left unchanged.   07/29/2016 IMPRESSION: Right hemispheric hemorrhage unchanged. Intraventricular hemorrhage unchanged. 8 mm midline shift to the left unchanged. Mild hydrocephalus unchanged   Dg Chest Port 1 View 07/25/16  1. Tip of the left IJ catheter is in the projection of the SVC. 2. New right lung opacity, which may reflect asymmetric edema or pneumonia.   07/30/2016 1. Stable support apparatus. 2. Opacity in the right base is mildly more prominent. Recommend follow-up to resolution.  07/31/2016 Stable support apparatus. Mild opacity in the right base, similar  in the interval.  Recommend follow-up to resolution.  TEE Left ventricle: The cavity size was normal. Wall thickness was   increased in a pattern of mild LVH. Systolic function was   vigorous. The estimated ejection fraction was in the range of 65%   to 70%. Wall motion was normal; there were no regional wall   motion abnormalities. Doppler parameters are consistent with   abnormal left ventricular relaxation (grade 1 diastolic   dysfunction). - Aortic valve: Trileaflet; mildly thickened leaflets. - Left atrium: The atrium was mildly dilated. Impressions: - No cardiac source of emboli was indentified.  PHYSICAL EXAM  Temp:  [99 F (37.2 C)-101.9 F (38.8 C)] 101.5 F (38.6 C) (11/13 0800) Pulse Rate:  [79-95] 92 (11/13 1000) Resp:  [19-32] 28 (11/13 1000) BP: (108-142)/(54-65) 128/58 (11/13 1000) SpO2:  [100 %] 100 % (11/13 1000) FiO2 (%):  [40 %] 40 % (11/13 0851) Weight:  [168 lb 3.4 oz (76.3 kg)] 168 lb 3.4 oz (76.3 kg) (11/13 0500)  General - intubated, sedated. Does not following any commands.   Neuro - intubated, eyes closed, no respond to voice. Does not follow any commands, PERRL, not blink to visual threat bilaterally, Fundi not visualized.Left UE flaccid. Withdraws to pain on right upper and lower ext, not to the left side. Sensation, coordination and gait not tested.    ASSESSMENT/PLAN [name] is a 65 y.o. 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 He did not receive IV t-PA due to ICH.   ICH: Large acute right intraparenchymal hematoma centered near the right basal ganglia, likely due to long-standing HTN and DM.  Resultant  Left hemiplegia, drowsiness  CT - Large acute intraparenchymal hematoma.  Repeat CT stable hematoma but increased edema and developing hydrocephalus  CT repeat 07/27/16 no significant change  CT repeat 07/29/16 no  significant change  Carotid Doppler - not ordered  2D Echo - EF 65-70%  LDL - 106  HgbA1c - 8.2  VTE prophylaxis - heparin subq Diet NPO time specified  aspirin 81  mg daily prior to admission, now on No antithrombotic secondary to ICH.  Patient counseled to be compliant with his antithrombotic medications  Ongoing aggressive stroke risk factor management  Therapy recommendations:  pending  Disposition:  Pending  Will need to discuss the GOC with the family. Less likely to be extubated successfully, will likely need to be trached if aggressive treatment is desired.  Will get CTangio head today  Cerebral edema  CT showed Large ICH with midline shift  CT repeat 07/27/16 and 07/29/16 no significant change  Off 3% saline   Central line d/c'ed  Na Goal 150-160  Na now 159  On free water now  Respiratory failure  Intubated for airway protection  TRACHEAL ASPIRATE growing MSSA - on ancef  On vent  CCM on board  CXR - opacity right lung base  Fever - could be central fever vs pneumonia  CXR - opacity right lung base  Sputum culture showed MSSA on ancef  Treat with tylenol.  UA negative   Blood culture NGTD  Diarrhea   Likely due toTF  C diff negative  On rectal bag  Hypertension  Stable  BP goal < 160  Off cleviprex  On amlodipine. Holding lisinopril due to AKI.  on labetalol tp 200mg  tid for BP control  Hyperlipidemia  Home meds:  Fish oil and cinnamon prior to admission  LDL 106 goal < 70  Hold off statin at this time due to large ICH  Diabetes  HgbA1c 8.2, goal < 7.0  Uncontrolled  On insulin drip  SSI  Dysphagia   Did not pass swallow  On TF for nutrition  Other Stroke Risk Factors  Advanced age  Hx of ICH 15 years ago without residue  Other Active Problems  Thrombocytopenia improving.  Anemia stable.   Hypokalemia - replete as needed.   Renal insufficiency - hold lisinopril.   Hospital day #  9 Hyacinth Meeker, M.D, PGY-3  I have personally examined this patient, reviewed notes, independently viewed imaging studies, participated in medical decision making and plan of care.ROS completed by me personally and pertinent positives fully documented  I have made any additions or clarifications directly to the above note. Agree with note above.   his patient is critically ill due to large right hemisphere ICH, hypertension, uncontrolled diabetes, central fever and at significant risk of neurological worsening, including death form hematoma expansion, cerebral edema, brain herniation, DKA, hypertensive emergency. This patient's care requires constant monitoring of vital signs, hemodynamics, respiratory and cardiac monitoring, review of multiple databases, neurological assessment, discussion with family, other specialists and medical decision making of high complexity total neurocritical care time spent 34 minutes. Delia Heady, MD Medical Director [facility] Pager: [phone] 08/01/2016 1:35 PM  Tty.

## 2016-08-02 ENCOUNTER — Inpatient Hospital Stay (HOSPITAL_COMMUNITY): Payer: Medicare Other

## 2016-08-02 DIAGNOSIS — G936 Cerebral edema: Secondary | ICD-10-CM

## 2016-08-02 LAB — BLOOD GAS, ARTERIAL
Acid-base deficit: 0.1 mmol/L (ref 0.0–2.0)
Bicarbonate: 22.2 mmol/L (ref 20.0–28.0)
Drawn by: 41977
FIO2: 40
LHR: 14 {breaths}/min
O2 SAT: 99.3 %
PATIENT TEMPERATURE: 98.6
PCO2 ART: 25.8 mmHg — AB (ref 32.0–48.0)
PEEP/CPAP: 5 cmH2O
PH ART: 7.544 — AB (ref 7.350–7.450)
VT: 620 mL
pO2, Arterial: 161 mmHg — ABNORMAL HIGH (ref 83.0–108.0)

## 2016-08-02 LAB — GLUCOSE, CAPILLARY
GLUCOSE-CAPILLARY: 146 mg/dL — AB (ref 65–99)
GLUCOSE-CAPILLARY: 157 mg/dL — AB (ref 65–99)
GLUCOSE-CAPILLARY: 168 mg/dL — AB (ref 65–99)
GLUCOSE-CAPILLARY: 181 mg/dL — AB (ref 65–99)
GLUCOSE-CAPILLARY: 183 mg/dL — AB (ref 65–99)
GLUCOSE-CAPILLARY: 213 mg/dL — AB (ref 65–99)
GLUCOSE-CAPILLARY: 214 mg/dL — AB (ref 65–99)
GLUCOSE-CAPILLARY: 214 mg/dL — AB (ref 65–99)
Glucose-Capillary: 124 mg/dL — ABNORMAL HIGH (ref 65–99)
Glucose-Capillary: 123 mg/dL — ABNORMAL HIGH (ref 65–99)
Glucose-Capillary: 140 mg/dL — ABNORMAL HIGH (ref 65–99)
Glucose-Capillary: 178 mg/dL — ABNORMAL HIGH (ref 65–99)
Glucose-Capillary: 162 mg/dL — ABNORMAL HIGH (ref 65–99)
Glucose-Capillary: 133 mg/dL — ABNORMAL HIGH (ref 65–99)
Glucose-Capillary: 135 mg/dL — ABNORMAL HIGH (ref 65–99)
Glucose-Capillary: 159 mg/dL — ABNORMAL HIGH (ref 65–99)
Glucose-Capillary: 151 mg/dL — ABNORMAL HIGH (ref 65–99)
Glucose-Capillary: 148 mg/dL — ABNORMAL HIGH (ref 65–99)
Glucose-Capillary: 169 mg/dL — ABNORMAL HIGH (ref 65–99)
Glucose-Capillary: 167 mg/dL — ABNORMAL HIGH (ref 65–99)
Glucose-Capillary: 164 mg/dL — ABNORMAL HIGH (ref 65–99)
Glucose-Capillary: 163 mg/dL — ABNORMAL HIGH (ref 65–99)

## 2016-08-02 LAB — BASIC METABOLIC PANEL
ANION GAP: 7 (ref 5–15)
BUN: 53 mg/dL — ABNORMAL HIGH (ref 6–20)
CALCIUM: 8.3 mg/dL — AB (ref 8.9–10.3)
CO2: 24 mmol/L (ref 22–32)
Chloride: 124 mmol/L — ABNORMAL HIGH (ref 101–111)
Creatinine, Ser: 1.3 mg/dL — ABNORMAL HIGH (ref 0.61–1.24)
GFR calc Af Amer: >60 mL/min (ref >60)
GFR calc non Af Amer: 55 mL/min — ABNORMAL LOW (ref >60)
GLUCOSE: 150 mg/dL — AB (ref 65–99)
Potassium: 3.5 mmol/L (ref 3.5–5.1)
Sodium: 155 mmol/L — ABNORMAL HIGH (ref 135–145)

## 2016-08-02 LAB — MAGNESIUM: Magnesium: 2.5 mg/dL — ABNORMAL HIGH (ref 1.7–2.4)

## 2016-08-02 LAB — PHOSPHORUS: Phosphorus: 3.9 mg/dL (ref 2.5–4.6)

## 2016-08-02 LAB — CBC
HEMATOCRIT: 29.4 % — AB (ref 39.0–52.0)
Hemoglobin: 9.3 g/dL — ABNORMAL LOW (ref 13.0–17.0)
MCH: 29.4 pg (ref 26.0–34.0)
MCHC: 31.6 g/dL (ref 30.0–36.0)
MCV: 93 fL (ref 78.0–100.0)
Platelets: 102 10*3/uL — ABNORMAL LOW (ref 150–400)
RBC: 3.16 MIL/uL — ABNORMAL LOW (ref 4.22–5.81)
RDW: 13.8 % (ref 11.5–15.5)
WBC: 10.8 10*3/uL — AB (ref 4.0–10.5)

## 2016-08-02 IMAGING — CT CT ANGIO HEAD
1 series · 1 of 5 positions shown · IV contrast (isovue)
Comparison: Head CT from 4 days ago

CLINICAL DATA: Cerebral hemorrhage.

EXAM:
CT ANGIOGRAPHY HEAD
TECHNIQUE: Multidetector CT imaging of the head was performed using the
standard protocol during bolus administration of intravenous
contrast. Multiplanar CT image reconstructions and MIPs were
obtained to evaluate the vascular anatomy.
CONTRAST:  50 cc Isovue 370 intravenous

[Series 403: — · 0.16mm/px · 1 of 5 slices shown]
[im 3/5]
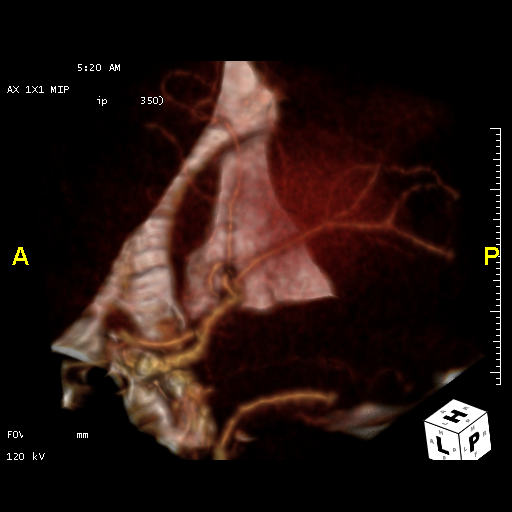

[1 of 5 positions shown; findings below may reference images not displayed]

FINDINGS: CT HEAD

Brain: Known parenchymal hemorrhage involving the right thalamus,
putamen, and deep right cerebral white matter. Hematoma shape and
size is stable from prior, measuring up to 62 mm in AP dimension.
Clot has more hazy margins from early clot breakdown. The degree of
surrounding edema is increased, with midline shift measuring 1 cm at
the interthalamic adhesion. Hemorrhage within the lateral ventricles
and third ventricle is decreasing in density but stable in volume.
Left lateral ventricle remains dilated. No ACA territory or other
interval infarct identified. Remote infarct affecting the left
occipital pole.

Vascular: Atherosclerotic calcification.  No hyperdense vessel.

Skull: Negative

Sinuses: Clear despite nasal intubation.

Orbits: Negative

CTA HEAD

Anterior circulation: Symmetric carotid arteries and branching.

Early branching right MCA with 3 mm base to the dome aneurysm at the
MCA bifurcation projected superiorly and laterally. Aneurysm has a
relatively wide neck. There is posterior lobulation from the sac on
sagittal and 3D reformats. The aneurysm is in close proximity and is
directed towards the parenchymal hemorrhage, but is directly
contiguous. No other aneurysm. No evidence of vascular malformation.
No spot sign. Right MCA convexity branches are displaced and
somewhat attenuated by mass-effect.

Scattered atheromatous irregularity discussion of scattered
atheromatous type irregularity on MCA and ACA branches. Most
proximally is a moderate focal stenosis of the left A1 origin.

Posterior circulation: Standard vertebrobasilar branching. Mild
atheromatous type narrowing of distal left V4 segment. The basilar
is smooth and widely patent. Bilateral P3 branch stenoses that are
high-grade, especially on the right. Negative for aneurysm.

Venous sinuses: Patent

Anatomic variants: None significant.

Delayed phase: No evidence of parenchymal enhancement or mass.
IMPRESSION: 1. 3 mm lobulated aneurysm at the right MCA bifurcation. The
aneurysm is in close proximity to and directed towards the cerebral
hematoma, but not clearly contiguous/causative.
2. Size stable right cerebral hematoma with intraventricular
extension. Peripheral edema is increased and midline shift now
measures 1 cm. Left lateral ventricle dilatation is stable.
3. Intracranial atherosclerosis bilateral high-grade bilateral P3
segment stenoses.

## 2016-08-02 MED ORDER — IOPAMIDOL (ISOVUE-370) INJECTION 76%
INTRAVENOUS | Status: AC
  Administered 2016-08-02: 50 mL
  Filled 2016-08-02: qty 50

## 2016-08-02 NOTE — Progress Notes (Signed)
PULMONARY / CRITICAL CARE MEDICINE   Name: Seth PerfectRichard M Potter MRN: [id number] DOB: 1951/09/19    ADMISSION DATE:  08/13/2016 CONSULTATION DATE:  07/27/2016  REFERRING MD:  Dr. Roda ShuttersXu, neurology.  CHIEF COMPLAINT:  Acute respiratory failure, unable to protect airway.  HISTORY OF PRESENT ILLNESS:   65 year old male with an acute hypertensive bleed who presents with AMS and inability to protect his airway.  PCCM consulted for intubation and medical management.  SUBJECTIVE:  No events overnight, weaning but remains unresponsive, ct results noted  VITAL SIGNS: BP (!) 130/59   Pulse 90   Temp 98.5 F (36.9 C) (Axillary)   Resp (!) 30   Ht 6' (1.829 m) Comment: per wife  Wt 169 lb 8 oz (76.9 kg)   SpO2 100%   BMI 22.99 kg/m   VENTILATOR SETTINGS: Vent Mode: PSV;CPAP FiO2 (%):  [40 %] 40 % Set Rate:  [14 bmp] 14 bmp Vt Set:  [620 mL] 620 mL PEEP:  [5 cmH20] 5 cmH20 Pressure Support:  [5 cmH20] 5 cmH20 Plateau Pressure:  [15 cmH20-17 cmH20] 15 cmH20  INTAKE / OUTPUT: I/O last 3 completed shifts: In: 4374.7 [I.V.:199.7; WU/JW:1191G/GT:3675; IV Piggyback:500] Out: 3625 [Urine:2925; Stool:700]  PHYSICAL EXAMINATION: General: critically ill appearing male in NAD Neuro: not following commands, spontaneous movement of RUE, RLE, LLE HEENT: ETT in place, small bore feeding tube Cardiovascular: regular, no murmur Lungs: non-labored, coarse bilaterally   Abdomen:  Soft, non tender Musculoskeletal: no edema Skin: no rashes  LABS:  BMET  Recent Labs Lab 07/31/16 0836  07/31/16 1548  07/31/16 2326 08/01/16 0706 08/02/16 0302  NA 160*  < > 161*  < > 158* 159* 155*  K 3.8  --  3.4*  --   --   --  3.5  CL 130*  --  129*  --   --   --  124*  CO2 21*  --  23  --   --   --  24  BUN 58*  --  57*  --   --   --  53*  CREATININE 1.43*  --  1.32*  --   --   --  1.30*  GLUCOSE 212*  --  153*  --   --   --  150*  < > = values in this interval not displayed.  Electrolytes  Recent Labs Lab  07/26/16 1544  07/28/16 0530  07/31/16 0836 07/31/16 1548 08/02/16 0302  CALCIUM  --   < > 8.8*  < > 8.4* 8.7* 8.3*  MG 2.2  --  2.1  --   --   --  2.5*  PHOS 2.6  --  2.2*  --   --   --  3.9  < > = values in this interval not displayed.  CBC  Recent Labs Lab 07/29/16 0635 07/30/16 0547 08/02/16 0302  WBC 10.4 6.4 10.8*  HGB 10.8* 10.0* 9.3*  HCT 34.1* 32.1* 29.4*  PLT 101* 85* 102*    Coag's No results for input(s): APTT, INR in the last 168 hours.  Sepsis Markers No results for input(s): LATICACIDVEN, PROCALCITON, O2SATVEN in the last 168 hours.  ABG  Recent Labs Lab 07/27/16 1308 07/28/16 0319 08/02/16 0425  PHART 7.500* 7.522* 7.544*  PCO2ART 33.7 27.5* 25.8*  PO2ART 134* 94.9 161*    Liver Enzymes No results for input(s): AST, ALT, ALKPHOS, BILITOT, ALBUMIN in the last 168 hours.  Cardiac Enzymes  Recent Labs Lab 07/27/16 0615  TROPONINI 0.11*  Glucose  Recent Labs Lab 08/02/16 0235 08/02/16 0328 08/02/16 0437 08/02/16 0541 08/02/16 0633 08/02/16 0727  GLUCAP 123* 140* 168* 183* 214* 213*   Imaging Ct Angio Head W Or Wo Contrast  Result Date: 08/02/2016 CLINICAL DATA:  Cerebral hemorrhage. EXAM: CT ANGIOGRAPHY HEAD TECHNIQUE: Multidetector CT imaging of the head was performed using the standard protocol during bolus administration of intravenous contrast. Multiplanar CT image reconstructions and MIPs were obtained to evaluate the vascular anatomy. CONTRAST:  50 cc Isovue 370 intravenous COMPARISON:  Head CT from 4 days ago FINDINGS: CT HEAD Brain: Known parenchymal hemorrhage involving the right thalamus, putamen, and deep right cerebral white matter. Hematoma shape and size is stable from prior, measuring up to 62 mm in AP dimension. Clot has more hazy margins from early clot breakdown. The degree of surrounding edema is increased, with midline shift measuring 1 cm at the interthalamic adhesion. Hemorrhage within the lateral ventricles and  third ventricle is decreasing in density but stable in volume. Left lateral ventricle remains dilated. No ACA territory or other interval infarct identified. Remote infarct affecting the left occipital pole. Vascular: Atherosclerotic calcification.  No hyperdense vessel. Skull: Negative Sinuses: Clear despite nasal intubation. Orbits: Negative CTA HEAD Anterior circulation: Symmetric carotid arteries and branching. Early branching right MCA with 3 mm base to the dome aneurysm at the MCA bifurcation projected superiorly and laterally. Aneurysm has a relatively wide neck. There is posterior lobulation from the sac on sagittal and 3D reformats. The aneurysm is in close proximity and is directed towards the parenchymal hemorrhage, but is directly contiguous. No other aneurysm. No evidence of vascular malformation. No spot sign. Right MCA convexity branches are displaced and somewhat attenuated by mass-effect. Scattered atheromatous irregularity discussion of scattered atheromatous type irregularity on MCA and ACA branches. Most proximally is a moderate focal stenosis of the left A1 origin. Posterior circulation: Standard vertebrobasilar branching. Mild atheromatous type narrowing of distal left V4 segment. The basilar is smooth and widely patent. Bilateral P3 branch stenoses that are high-grade, especially on the right. Negative for aneurysm. Venous sinuses: Patent Anatomic variants: None significant. Delayed phase: No evidence of parenchymal enhancement or mass. IMPRESSION: 1. 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 The aneurysm is in close proximity to and directed towards the cerebral hematoma, but not clearly contiguous/causative. 2. Size stable right cerebral hematoma with intraventricular extension. Peripheral edema is increased and midline shift now measures 1 cm. Left lateral ventricle dilatation is stable. 3. Intracranial atherosclerosis bilateral high-grade bilateral P3 segment stenoses.  Electronically Signed   By: Marnee Spring M.D.   On: 08/02/2016 07:05   Dg Chest Port 1 View  Result Date: 08/02/2016 CLINICAL DATA:  Acute intra cranial hemorrhage. Acute respiratory failure, intubated patient. EXAM: PORTABLE CHEST 1 VIEW COMPARISON:  Portable chest x-ray of November 13 [age] teen FINDINGS: The lungs are well-expanded and clear. The heart and pulmonary vascularity are normal. The mediastinum is normal in width. There is calcification in the wall of the aortic arch. The endotracheal tube tip lies 5.6 cm above the carina. The feeding tube tip projects below the inferior margin of the image. IMPRESSION: Intubated patient. No pneumonia, CHF, nor other acute cardiopulmonary abnormality. Thoracic aortic atherosclerosis. Electronically Signed   By: David  [place] M.D.   On: 08/02/2016 07:24   STUDIES:  11/7 ECHO >> mild LVH, EF 65 to 70%, grade 1 DD 11/8 CT Head >> increased hemorrhage and midline shift.  CULTURES: 11/5 BC >> negative  11/5  urine >> negative 11/8 BC >>negative 11/8 Sputum >> MSSA 11/11 C-diff >> negative  ANTIBIOTICS: Vancomycin 11/09 >> 11/11 Elita QuickFortaz 11/09 >> 11/11 Ancef 11/11 >>   SIGNIFICANT EVENTS: 11/04  Admit with AMS, acute hypertensive bleed 11/08  intubation for airway protection  LINES/TUBES: Lt IJ CVL 11/06 >> 11/11 ETT 11/8 >>  DISCUSSION: 65 year old male with hypertensive ICH now with increased bleeding and midline shift. CT can 11/14 increased edema  ASSESSMENT / PLAN:  Acute hypoxic respiratory failure with inability to protect airway in setting of ICH and aspiration pneumonia. - PRVC 8 cc/kg - Wean PEEP / FiO2 for sats >92% - PS trials but no extubation given mental status - Need to decide on trach, need family meeting with neurology, currently not extubatable. - F/u CXR - Day 6 of Abx  ICH with cerebral edema. - Completed induced hypernatremia per neurology 11/5 - Defer Na management to neuro at this point. - CT 11/14  as noted  HTN emergency. Hx of HLD. - Goal SBP < 160 - Continue norvasc, labetalol  Hypokalemia  Hypernatremia  Recent Labs Lab 07/31/16 2326 08/01/16 0706 08/02/16 0302  NA 158* 159* 155*    Recent Labs Lab 07/31/16 0836 07/31/16 1548 08/02/16 0302  K 3.8 3.4* 3.5     - Trend BMP / UOP  - Replace electrolytes as indicated  - Free water added per Neuro   Thrombocytopenia  - Monitor CBC, evidence of bleeding  Nutrition. - Tube feeding per nutrition  DM type II. - Insulin gtt  BPH  -Continue flomax, verified with pharmacy can be opened/crushed, females should not touch / wear gloves when handling - I/O cath as needed   DVT prophylaxis - SCDs SUP - Protonix Goals of care - Full code  App CCT 31 min.   Brett CanalesSteve Minor ACNP [facility] Pager [phone] till 3 pm If no answer page [phone] 08/02/2016, 9:42 AM  Attending Note:  65 year old with ICH who is failing to make progress from a neurologic standpoint.  Patient is weaning well on exam but not following commands.  On exam, he is unresponsive but tolerating wean.  I reviewed CXR myself ETT ok.  Discussed with Dr. Pearlean BrownieSethi, he spoke with the wife at length and answered all questions.  She has been made aware that by the end of the week she has to decide on trach/peg vs withdrawal.  She will let us know but in the meantime will continue as full code.  Replace electrolytes.  Hold diureses and monitor in the ICU setting.  The patient is critically ill with multiple organ systems failure and requires high complexity decision making for assessment and support, frequent evaluation and titration of therapies, application of advanced monitoring technologies and extensive interpretation of multiple databases.   Critical Care Time devoted to patient care services described in this note is  35  Minutes. This time reflects time of care of this signee Dr Koren BoundWesam Shakayla Hickox. This critical care time does not reflect procedure time, or  teaching time or supervisory time of PA/NP/Med student/Med Resident etc but could involve care discussion time.  Alyson ReedyWesam G. Harjit Leider, M.D. [facility]. Pager: [phone]. After hours pager: [phone].

## 2016-08-02 NOTE — Consult Note (Signed)
CC:  Chief Complaint  Patient presents with  . Code Stroke    HPI: Seth Potter is a 65 y.o. male admitted 10 days ago after he suddenly lost consciousness while hunting. Upon arrival to the ED he was left hemiplegic, decreased level of consciousness. Code stroke was called and CT demonstrated large right ganglionic hemorrhage. He was admitted to the neuro ICU where he developed resiratory failure and required intubation. Unfortunately, he has not made any neurologic recovery. 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  PMH: Past Medical History:  Diagnosis Date  . Diabetes mellitus without complication (HCC)   . Kidney stones     PSH: Past Surgical History:  Procedure Laterality Date  . APPENDECTOMY      SH: Social History  Substance Use Topics  . Smoking status: Never Smoker  . Smokeless tobacco: Not on file  . Alcohol use No    MEDS: Prior to Admission medications   Medication Sig Start Date End Date Taking? Authorizing Provider  aspirin EC 81 MG tablet Take 81 mg by mouth daily.    Yes Historical Provider, MD  CINNAMON PO Take 1 capsule by mouth daily.   Yes Historical Provider, MD  fish oil-omega-3 fatty acids 1000 MG capsule Take 1 g by mouth daily.    Yes Historical Provider, MD  glipiZIDE (GLUCOTROL) 10 MG tablet Take 10 mg by mouth 2 (two) times daily. 06/13/16  Yes Historical Provider, MD  metFORMIN (GLUCOPHAGE) 1000 MG tablet Take 1,000 mg by mouth 2 (two) times daily with a meal.   Yes Historical Provider, MD    ALLERGY: No Known Allergies  ROS: ROS  NEUROLOGIC EXAM: Intubated, breathes over vent No eye opening to pain Pupils sluggishly reactive (+) cough/gag W/D RUE, no movement LUE Minimal w/d RLE, no movement LLE  IMGAING: CTA demonstrates a large ~5-6cm right ganglionic hemorrhage with surrounding edema. There is mass effect  upon the lateral ventricle without HCP. CTA shows a small ~503mm superiorly/laterally projecing MCA bifurcation aneurysm in proximity to the inferomesial portion of the hematoma.  IMPRESSION: - 65 y.o. male with large BG hemorrhage, poor neurologic exam with poor prognosis. Unclear if the hemorrhage is hypertensive or related to the aneurysm. Regardless, with his current neurologic condition, I don't think treatment of his aneurysm is reasonable as it is not going to change his prognosis. If the family decides to cont care and proceed with trach/PEG AND he shows signs of improvement, then we may consider treatment  PLAN: - Would cont current supportive care for now, plan for family to decide about comfort care vs trach/PEG.

## 2016-08-02 NOTE — Progress Notes (Signed)
RT note-Placed back on full support, increased work of breathing.

## 2016-08-02 NOTE — Progress Notes (Signed)
Patient transported from 3M08 to CT and back without any complications.

## 2016-08-02 NOTE — Progress Notes (Signed)
STROKE TEAM PROGRESS NOTE   SUBJECTIVE (INTERVAL HISTORY) Afebrile. Wife at bedside. BP is ok now. Intubated. Remains non responsive. CT angiogram done this morning shows stable appearance of the hematoma and cytotoxic edema but does show a  small right MCA bifurcation aneurysm  OBJECTIVE Temp:  [98.5 F (36.9 C)-101.8 F (38.8 C)] 98.5 F (36.9 C) (11/14 0800) Pulse Rate:  [73-92] 90 (11/14 0900) Cardiac Rhythm: Normal sinus rhythm (11/13 2000) Resp:  [18-31] 30 (11/14 0900) BP: (108-146)/(52-67) 130/59 (11/14 0900) SpO2:  [100 %] 100 % (11/14 0900) FiO2 (%):  [40 %] 40 % (11/14 0753) Weight:  [169 lb 8 oz (76.9 kg)] 169 lb 8 oz (76.9 kg) (11/14 0334)  CBC:   Recent Labs Lab 07/29/16 0635 07/30/16 0547 08/02/16 0302  WBC 10.4 6.4 10.8*  NEUTROABS 9.2*  --   --   HGB 10.8* 10.0* 9.3*  HCT 34.1* 32.1* 29.4*  MCV 92.9 92.8 93.0  PLT 101* 85* 102*    Basic Metabolic Panel:   Recent Labs Lab 07/28/16 0530  07/31/16 1548  08/01/16 0706 08/02/16 0302  NA 160*  < > 161*  < > 159* 155*  K 3.1*  < > 3.4*  --   --  3.5  CL 127*  < > 129*  --   --  124*  CO2 24  < > 23  --   --  24  GLUCOSE 224*  < > 153*  --   --  150*  BUN 53*  < > 57*  --   --  53*  CREATININE 1.45*  < > 1.32*  --   --  1.30*  CALCIUM 8.8*  < > 8.7*  --   --  8.3*  MG 2.1  --   --   --   --  2.5*  PHOS 2.2*  --   --   --   --  3.9  < > = values in this interval not displayed.  Lipid Panel:     Component Value Date/Time   CHOL 161 07/25/2016 0228   TRIG 95 07/25/2016 0228   HDL 36 (L) 07/25/2016 0228   CHOLHDL 4.5 07/25/2016 0228   VLDL 19 07/25/2016 0228   LDLCALC 106 (H) 07/25/2016 0228   HgbA1c:  Lab Results  Component Value Date   HGBA1C 8.2 (H) 07/25/2016   Urine Drug Screen:     Component Value Date/Time   LABOPIA NONE DETECTED 08-12-16 0918   COCAINSCRNUR NONE DETECTED August 12, 2016 0918   LABBENZ NONE DETECTED August 12, 2016 0918   AMPHETMU NONE DETECTED August 12, 2016 0918   THCU NONE  DETECTED 08/12/16 0918   LABBARB NONE DETECTED August 12, 2016 0918      IMAGING I have personally reviewed the radiological images below and agree with the radiology interpretations.  Ct Head Code Stroke W/o Cm 08/12/16 1. Large acute intraparenchymal hematoma measuring 64 x 53 x 69 mm (estimated volume 115 cc) centered near the right basal ganglia. Localized mass effect with up to 14 mm of right-to-left shift.  2. Associated intraventricular extension with blood in the lateral and third ventricles. Dilatation of the temporal horns of both lateral ventricles without hydrocephalus.  3. Probable subdural extension with tiny right subdural hematoma measuring up to 3 mm.  4. Underlying chronic microvascular ischemic disease.   CT Head WO Contrast - 07/24/16 1. Unchanged size of large right basal ganglia region parenchymal hemorrhage. Slightly increased edema without increased midline shift. 2. Increased intraventricular hemorrhage with mild interval left  lateral ventricular dilatation concerning for developing Hydrocephalus.  Ct Head Wo Contrast 07/27/2016 IMPRESSION: Right basal ganglia and frontal parenchymal hemorrhage with intraventricular extension unchanged. Moderate hydrocephalus unchanged. 7 mm midline shift to the left unchanged.   07/29/2016 IMPRESSION: Right hemispheric hemorrhage unchanged. Intraventricular hemorrhage unchanged. 8 mm midline shift to the left unchanged. Mild hydrocephalus unchanged   Dg Chest Port 1 View 07/25/16  1. Tip of the left IJ catheter is in the projection of the SVC. 2. New right lung opacity, which may reflect asymmetric edema or pneumonia.   07/30/2016 1. Stable support apparatus. 2. Opacity in the right base is mildly more prominent. Recommend follow-up to resolution.  07/31/2016 Stable support apparatus. Mild opacity in the right base, similar in the interval.  Recommend follow-up to resolution.  TEE Left ventricle: The cavity size was  normal. Wall thickness was   increased in a pattern of mild LVH. Systolic function was   vigorous. The estimated ejection fraction was in the range of 65%   to 70%. Wall motion was normal; there were no regional wall   motion abnormalities. Doppler parameters are consistent with   abnormal left ventricular relaxation (grade 1 diastolic   dysfunction). - Aortic valve: Trileaflet; mildly thickened leaflets. - Left atrium: The atrium was mildly dilated. Impressions: - No cardiac source of emboli was indentified.  CT angiogram brain 08/02/16 : 1. 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 The aneurysm is in close proximity to and directed towards the cerebral hematoma, but not clearly contiguous/causative. 2. Size stable right cerebral hematoma with intraventricular extension. Peripheral edema is increased and midline shift now measures 1 cm. Left lateral ventricle dilatation is stable. 3. Intracranial atherosclerosis bilateral high-grade bilateral P3 segment stenoses. PHYSICAL EXAM  Temp:  [98.5 F (36.9 C)-101.8 F (38.8 C)] 98.5 F (36.9 C) (11/14 0800) Pulse Rate:  [73-92] 90 (11/14 0900) Resp:  [18-31] 30 (11/14 0900) BP: (108-146)/(52-67) 130/59 (11/14 0900) SpO2:  [100 %] 100 % (11/14 0900) FiO2 (%):  [40 %] 40 % (11/14 0753) Weight:  [169 lb 8 oz (76.9 kg)] 169 lb 8 oz (76.9 kg) (11/14 0334)  General - intubated, sedated. Does not following any commands.   Neuro - intubated, eyes closed, resists eye opening, no respond to voice. Does not follow any commands, PERRL, not blink to visual threat bilaterally, Fundi not visualized. Moving left leg, does not move left arm to even pain, moves right arm and right leg spontaneously. Sensation, coordination and gait not tested.    ASSESSMENT/PLAN Mr. LISA BLAKEMAN is a 65 y.o. 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 He did not receive IV t-PA due to ICH.   ICH: Large acute right intraparenchymal hematoma centered near the right basal ganglia, likely due to long-standing HTN and DM. But also has 3mm lobulated aneurysm at R MCA bifurct which is near the area of hemorrhage.   Resultant  Left hemiplegia, drowsiness  CT - Large acute intraparenchymal hematoma.  Repeat CT stable hematoma but increased edema and developing hydrocephalus  CT repeat 07/27/16 no significant change  CT repeat 07/29/16 no significant change  Carotid Doppler - not ordered  2D Echo - EF 65-70%  LDL - 106  HgbA1c - 8.2  VTE prophylaxis - heparin subq Diet NPO time specified  aspirin 81 mg daily prior to admission, now on No antithrombotic secondary to ICH.  Patient counseled  to be compliant with his antithrombotic medications  Ongoing aggressive stroke risk factor management  Therapy recommendations:  pending  Disposition:  Pending  Discussed all the findings with the patient's family including the aneurysm which could be a cause of the hemorrhage. Given patient has 115 cc volume of hemorrhage, his overall prognosis is very poor. With his current neuro status he is not going to be successfully extubated, will likely need to be trached if aggressive treatment is desired. Family needs more time to decide whether to try terminal extubation vs trach if needed.   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 Not clear if the hemorrhage was due to ruptured aneurysm vs this is just an incidental finding.  Discussed with Neuro surgery. Angiogram should only be done if family wants to be aggressive. It would give us better idea whether the aneurysm caused the hemorrhage.  However, regardless of what we do, will not change the outcome for this patient who already had the hemorrhage. Any intervention such as aneurysm clipping or other will be done later if patient makes any neurological improvement. Discussed extensively with the family and they will think about how to proceed.  Respiratory failure  Intubated for airway protection  TRACHEAL ASPIRATE growing MSSA - on ancef  On vent  CCM on board  CXR - opacity right lung base  Fever - could be central fever vs pneumonia  CXR - opacity right lung base  Sputum culture showed MSSA on ancef  Treat with tylenol.  UA negative   Blood culture NGTD  Diarrhea   Likely due toTF  On rectal bag  Hypertension  Stable  BP goal < 160  Off cleviprex  On amlodipine. Holding lisinopril due to AKI.  on labetalol tp 200mg  tid for BP control  Hyperlipidemia  Home meds:  Fish oil and cinnamon prior to admission  LDL 106 goal < 70  Hold off statin at this time due to large ICH  Diabetes  HgbA1c 8.2, goal < 7.0  Uncontrolled  On insulin drip  SSI  Dysphagia   Did not pass swallow  On TF for nutrition  Other Stroke Risk Factors  Advanced age  Hx of ICH 15 years ago without residue  Other Active Problems  Thrombocytopenia improving.  Anemia stable.   Hypokalemia - replete as needed.   Renal insufficiency - hold lisinopril.   Hospital day # 10 Hyacinth Meekerasrif Ahmed, M.D, PGY-3  I have personally examined this patient, reviewed notes, independently viewed imaging studies, participated in medical decision making and plan of care.ROS completed by me personally and pertinent positives fully documented  I have made any additions or clarifications directly to the above note. Agree with note above.  CT angiogram to show 3 mm lobulated right MCA bifurcation aneurysm but with absence of any significant subarachnoid hemorrhage in patient presenting with significantly elevated blood pressure on  admission it seems unlikely that this aneurysm is a cause of the patient's hemorrhage. I have reviewed the films personally and with neuroradiology and neurosurgery and they all seem to be in agreement. We will consult neurosurgery family to give their opinion. I had a long discussion with the patient's wife regarding the aneurysm and lack of  clear relationship with the patient's hemorrhage and this aneurysm. Regardless the hemorrhage is so large with volume of 110 cubic cc that  his prognosis is grim and it would  appear that treatment for  aneurysm at this point would not significantly change his prognosis. Family to have a discussion amongst themselves and decide about whether to continue aggressive treatment which wouldn't involve tracheostomy PEG tube and catheter angiogram versus withdrawal of care and comfort care This patient is critically ill and at significant risk of neurological worsening, death and care requires constant monitoring of vital signs, hemodynamics,respiratory and cardiac monitoring, extensive review of multiple databases, frequent neurological assessment, discussion with family, other specialists and medical decision making of high complexity.I have made any additions or clarifications directly to the above note.This critical care time does not reflect procedure time, or teaching time or supervisory time of PA/NP/Med Resident etc but could involve care discussion time.  I spent 45 minutes of neurocritical care time  in the care of  this patient.     Delia HeadyPramod Sethi, MD Medical Director [facility] Pager: [phone] 08/02/2016 4:30 PM

## 2016-08-02 NOTE — Progress Notes (Signed)
Extensive education given to family, reviewing prognosis and plan of care.

## 2016-08-03 ENCOUNTER — Inpatient Hospital Stay (HOSPITAL_COMMUNITY): Payer: Medicare Other

## 2016-08-03 LAB — GLUCOSE, CAPILLARY
GLUCOSE-CAPILLARY: 144 mg/dL — AB (ref 65–99)
GLUCOSE-CAPILLARY: 137 mg/dL — AB (ref 65–99)
GLUCOSE-CAPILLARY: 183 mg/dL — AB (ref 65–99)
GLUCOSE-CAPILLARY: 161 mg/dL — AB (ref 65–99)
GLUCOSE-CAPILLARY: 206 mg/dL — AB (ref 65–99)
Glucose-Capillary: 141 mg/dL — ABNORMAL HIGH (ref 65–99)
Glucose-Capillary: 141 mg/dL — ABNORMAL HIGH (ref 65–99)
Glucose-Capillary: 143 mg/dL — ABNORMAL HIGH (ref 65–99)
Glucose-Capillary: 148 mg/dL — ABNORMAL HIGH (ref 65–99)
Glucose-Capillary: 185 mg/dL — ABNORMAL HIGH (ref 65–99)
Glucose-Capillary: 223 mg/dL — ABNORMAL HIGH (ref 65–99)

## 2016-08-03 LAB — SODIUM: Sodium: 154 mmol/L — ABNORMAL HIGH (ref 135–145)

## 2016-08-03 MED ORDER — INSULIN ASPART 100 UNIT/ML ~~LOC~~ SOLN
6.0000 [IU] | SUBCUTANEOUS | Status: DC
  Administered 2016-08-03 – 2016-08-08 (×31): 6 [IU] via SUBCUTANEOUS

## 2016-08-03 MED ORDER — INSULIN GLARGINE 100 UNIT/ML ~~LOC~~ SOLN
35.0000 [IU] | SUBCUTANEOUS | Status: DC
  Administered 2016-08-03 – 2016-08-08 (×6): 35 [IU] via SUBCUTANEOUS
  Filled 2016-08-03 (×6): qty 0.35

## 2016-08-03 MED ORDER — INSULIN ASPART 100 UNIT/ML ~~LOC~~ SOLN
2.0000 [IU] | SUBCUTANEOUS | Status: DC
  Administered 2016-08-03 (×2): 4 [IU] via SUBCUTANEOUS
  Administered 2016-08-03: 6 [IU] via SUBCUTANEOUS
  Administered 2016-08-03: 4 [IU] via SUBCUTANEOUS
  Administered 2016-08-03: 6 [IU] via SUBCUTANEOUS
  Administered 2016-08-04 (×3): 4 [IU] via SUBCUTANEOUS
  Administered 2016-08-04: 2 [IU] via SUBCUTANEOUS
  Administered 2016-08-04: 4 [IU] via SUBCUTANEOUS
  Administered 2016-08-05: 2 [IU] via SUBCUTANEOUS
  Administered 2016-08-05 (×2): 4 [IU] via SUBCUTANEOUS
  Administered 2016-08-05: 6 [IU] via SUBCUTANEOUS
  Administered 2016-08-05: 4 [IU] via SUBCUTANEOUS
  Administered 2016-08-05: 6 [IU] via SUBCUTANEOUS
  Administered 2016-08-05 – 2016-08-06 (×5): 4 [IU] via SUBCUTANEOUS
  Administered 2016-08-06: 6 [IU] via SUBCUTANEOUS
  Administered 2016-08-06 – 2016-08-07 (×5): 4 [IU] via SUBCUTANEOUS
  Administered 2016-08-07 – 2016-08-08 (×5): 6 [IU] via SUBCUTANEOUS

## 2016-08-03 MED ORDER — LOPERAMIDE HCL 2 MG PO CAPS
2.0000 mg | ORAL_CAPSULE | ORAL | Status: DC | PRN
  Administered 2016-08-03 – 2016-08-05 (×5): 2 mg via ORAL
  Filled 2016-08-03 (×7): qty 1

## 2016-08-03 MED ORDER — DEXTROSE 10 % IV SOLN: INTRAVENOUS | Status: DC | PRN

## 2016-08-03 NOTE — Progress Notes (Signed)
STROKE TEAM PROGRESS NOTE   SUBJECTIVE (INTERVAL HISTORY) Afebrile. No family at bedside today. Patient remains unresponsive and intubated. Not following any commands. Continues to have diarrhea.   CBC:   Recent Labs Lab 07/29/16 0635 07/30/16 0547 08/02/16 0302  WBC 10.4 6.4 10.8*  NEUTROABS 9.2*  --   --   HGB 10.8* 10.0* 9.3*  HCT 34.1* 32.1* 29.4*  MCV 92.9 92.8 93.0  PLT 101* 85* 102*    Basic Metabolic Panel:   Recent Labs Lab 07/28/16 0530  07/31/16 1548  08/02/16 0302 08/03/16 0335  NA 160*  < > 161*  < > 155* 154*  K 3.1*  < > 3.4*  --  3.5  --   CL 127*  < > 129*  --  124*  --   CO2 24  < > 23  --  24  --   GLUCOSE 224*  < > 153*  --  150*  --   BUN 53*  < > 57*  --  53*  --   CREATININE 1.45*  < > 1.32*  --  1.30*  --   CALCIUM 8.8*  < > 8.7*  --  8.3*  --   MG 2.1  --   --   --  2.5*  --   PHOS 2.2*  --   --   --  3.9  --   < > = values in this interval not displayed.  Lipid Panel:     Component Value Date/Time   CHOL 161 07/25/2016 0228   TRIG 95 07/25/2016 0228   HDL 36 (L) 07/25/2016 0228   CHOLHDL 4.5 07/25/2016 0228   VLDL 19 07/25/2016 0228   LDLCALC 106 (H) 07/25/2016 0228   HgbA1c:  Lab Results  Component Value Date   HGBA1C 8.2 (H) 07/25/2016   Urine Drug Screen:     Component Value Date/Time   LABOPIA NONE DETECTED 07/31/2016 0918   COCAINSCRNUR NONE DETECTED 08/04/2016 0918   LABBENZ NONE DETECTED 07/30/2016 0918   AMPHETMU NONE DETECTED 08/13/2016 0918   THCU NONE DETECTED 07/26/2016 0918   LABBARB NONE DETECTED 07/31/2016 0918      IMAGING I have personally reviewed the radiological images below and agree with the radiology interpretations.  Ct Head Code Stroke W/o Cm 08/13/2016 1. Large acute intraparenchymal hematoma measuring 64 x 53 x 69 mm (estimated volume 115 cc) centered near the right basal ganglia. Localized mass effect with up to 14 mm of right-to-left shift.  2. Associated intraventricular extension with  blood in the lateral and third ventricles. Dilatation of the temporal horns of both lateral ventricles without hydrocephalus.  3. Probable subdural extension with tiny right subdural hematoma measuring up to 3 mm.  4. Underlying chronic microvascular ischemic disease.   CT Head WO Contrast - 07/24/16 1. Unchanged size of large right basal ganglia region parenchymal hemorrhage. Slightly increased edema without increased midline shift. 2. Increased intraventricular hemorrhage with mild interval left lateral ventricular dilatation concerning for developing Hydrocephalus.  Ct Head Wo Contrast 07/27/2016 IMPRESSION: Right basal ganglia and frontal parenchymal hemorrhage with intraventricular extension unchanged. Moderate hydrocephalus unchanged. 7 mm midline shift to the left unchanged.   07/29/2016 IMPRESSION: Right hemispheric hemorrhage unchanged. Intraventricular hemorrhage unchanged. 8 mm midline shift to the left unchanged. Mild hydrocephalus unchanged   Dg Chest Port 1 View 07/25/16  1. Tip of the left IJ catheter is in the projection of the SVC. 2. New right lung opacity, which may reflect asymmetric edema  or pneumonia.   07/30/2016 1. Stable support apparatus. 2. Opacity in the right base is mildly more prominent. Recommend follow-up to resolution.  07/31/2016 Stable support apparatus. Mild opacity in the right base, similar in the interval.  Recommend follow-up to resolution.  TEE Left ventricle: The cavity size was normal. Wall thickness was   increased in a pattern of mild LVH. Systolic function was   vigorous. The estimated ejection fraction was in the range of 65%   to 70%. Wall motion was normal; there were no regional wall   motion abnormalities. Doppler parameters are consistent with   abnormal left ventricular relaxation (grade 1 diastolic   dysfunction). - Aortic valve: Trileaflet; mildly thickened leaflets. - Left atrium: The atrium was mildly  dilated. Impressions: - No cardiac source of emboli was indentified.  CT angiogram brain 08/02/16 : 1. 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 The aneurysm is in close proximity to and directed towards the cerebral hematoma, but not clearly contiguous/causative. 2. Size stable right cerebral hematoma with intraventricular extension. Peripheral edema is increased and midline shift now measures 1 cm. Left lateral ventricle dilatation is stable. 3. Intracranial atherosclerosis bilateral high-grade bilateral P3 segment stenoses. PHYSICAL EXAM  Temp:  [98.3 F (36.8 C)-101.1 F (38.4 C)] 98.3 F (36.8 C) (11/15 1200) Pulse Rate:  [65-88] 70 (11/15 1156) Resp:  [16-29] 28 (11/15 1156) BP: (101-138)/(53-67) 101/55 (11/15 1156) SpO2:  [100 %] 100 % (11/15 1156) FiO2 (%):  [40 %] 40 % (11/15 1156) Weight:  [170 lb 10.2 oz (77.4 kg)] 170 lb 10.2 oz (77.4 kg) (11/15 0354)  General - intubated, sedated. Does not following any commands.   Neuro - intubated, eyes closed, resists eye opening, no respond to voice. Does not follow any commands, PERRL, not blink to visual threat bilaterally, Fundi not visualized. Moving left leg, does not move left arm to even pain, moves right arm and right leg spontaneously. Sensation, coordination and gait not tested.    ASSESSMENT/PLAN Seth Potter is a 65 y.o. 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 He did not receive IV t-PA due to ICH.   ICH: Large acute right intraparenchymal hematoma centered near the right basal ganglia, likely due to long-standing HTN and DM. 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  Resultant  Left hemiplegia, drowsiness  CT - Large acute intraparenchymal hematoma.  Repeat CT stable hematoma but increased  edema and developing hydrocephalus  CT repeat 07/27/16 no significant change  CT repeat 07/29/16 no significant change  Carotid Doppler - not ordered  2D Echo - EF 65-70%  LDL - 106  HgbA1c - 8.2  VTE prophylaxis - heparin subq Diet NPO time specified  aspirin 81 mg daily prior to admission, now on No antithrombotic secondary to ICH.  Patient counseled to be compliant with his antithrombotic medications  Ongoing aggressive stroke risk factor management  Therapy recommendations:  pending  Disposition:  Pending  Discussed all the findings with the patient's family including the aneurysm which could be a cause of the hemorrhage. Given patient has 115 cc volume of hemorrhage, his overall prognosis is very poor. With his current neuro status he is not going to be successfully extubated, will likely need to be trached if aggressive treatment is desired. Family needs more time  to decide whether to try terminal extubation vs tracheostomy/PEG tube and angiogram. Did not see family today. Will try to find out what they decided and proceed based on that.   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 Not clear if the hemorrhage was due to ruptured aneurysm vs this is just an incidental finding.  Discussed with Neuro surgery. Angiogram should only be done if family wants to be aggressive. It would give [place] better idea whether the aneurysm caused the hemorrhage. However, regardless of what we do, will not change the outcome for this patient who already had the hemorrhage. Any intervention such as aneurysm clipping or other will be done later if patient makes any neurological improvement. Discussed extensively with the family and they will think about how to  proceed.  Respiratory failure  Intubated for airway protection  TRACHEAL ASPIRATE growing MSSA - on ancef  On vent  CCM on board  CXR - opacity right lung base  Fever - could be central fever vs pneumonia  CXR - opacity right lung base  Sputum culture showed MSSA on ancef. Will treat for 7 days total. End date 11/16.  Treat with tylenol.  UA negative   Blood culture NGTD  Diarrhea   Likely due toTF. Will discuss with the nutritionist if we can change this to a less osmolar formulation.  On rectal bag  cdiff negative. Gave imodium starting 11/15.   Hypertension  Stable  BP goal < 160  Off cleviprex  On amlodipine. Holding lisinopril due to AKI.  on labetalol tp 200mg  tid for BP control  Hyperlipidemia  Home meds:  Fish oil and cinnamon prior to admission  LDL 106 goal < 70  Hold off statin at this time due to large ICH  Diabetes  HgbA1c 8.2, goal < 7.0  Uncontrolled  On insulin drip  SSI  Dysphagia   Did not pass swallow  On TF for nutrition  Other Stroke Risk Factors  Advanced age  Hx of ICH 15 years ago without residue  Other Active Problems  Thrombocytopenia improving.  Anemia stable.   Hypokalemia - replete as needed.   Renal insufficiency - hold lisinopril.   Hospital day # 24 Hyacinth Meeker, M.D, PGY-3  I have personally examined this patient, reviewed notes, independently viewed imaging studies, participated in medical decision making and plan of care.ROS completed by me personally and pertinent positives fully documented  I have made any additions or clarifications directly to the above note. Agree with note above. I had a long discussion with the patient's wife and sister-in-law regarding his poor prognosis due to large size of the hemorrhage. Patient's wife is planning to meet with rest of the family member later today and make a decision about continuing care versus withdrawal of care. She will let us know. I have  explained to her clearly that patient will need tracheostomy and PEG tube fifth prolonged support is to be continued. We will let her discuss with neurosurgery utility of doing catheter angiogram to evaluate cerebral aneurysm in case she decides to continue supportive care. This patient is critically ill and at significant risk of neurological worsening, death and care requires constant monitoring of vital signs, hemodynamics,respiratory and  cardiac monitoring, extensive review of multiple databases, frequent neurological assessment, discussion with family, other specialists and medical decision making of high complexity.I have made any additions or clarifications directly to the above note.This critical care time does not reflect procedure time, or teaching time or supervisory time of PA/NP/Med Resident etc but could involve care discussion time.  I spent 35 minutes of neurocritical care time  in the care of  this patient.      Delia Heady, MD Medical Director [facility] Pager: [phone] 08/03/2016 3:17 PM

## 2016-08-03 NOTE — Progress Notes (Signed)
PULMONARY / CRITICAL CARE MEDICINE   Name: Seth Potter MRN: [id number] DOB: 10-08-1950    ADMISSION DATE:  07/24/2016 CONSULTATION DATE:  07/27/2016  REFERRING MD:  Dr. Roda ShuttersXu, neurology.  CHIEF COMPLAINT:  Acute respiratory failure, unable to protect airway.  HISTORY OF PRESENT ILLNESS:   65 year old male with an acute hypertensive bleed who presents with AMS and inability to protect his airway.  PCCM consulted for intubation and medical management.  SUBJECTIVE:  No events overnight, weaning but remains unresponsive, ct results noted. Waiting for family to decide on trach.  VITAL SIGNS: BP 120/63   Pulse 72   Temp 99.1 F (37.3 C) (Axillary)   Resp 16   Ht 6' (1.829 m) Comment: per wife  Wt 170 lb 10.2 oz (77.4 kg)   SpO2 100%   BMI 23.14 kg/m   VENTILATOR SETTINGS: Vent Mode: PSV;CPAP FiO2 (%):  [40 %] 40 % Set Rate:  [14 bmp] 14 bmp Vt Set:  [620 mL] 620 mL PEEP:  [5 cmH20] 5 cmH20 Pressure Support:  [5 cmH20] 5 cmH20 Plateau Pressure:  [14 cmH20-18 cmH20] 14 cmH20  INTAKE / OUTPUT: I/O last 3 completed shifts: In: 5156.8 [I.V.:351.8; NG/GT:4005; IV Piggyback:800] Out: 3700 [Urine:3200; Stool:500]  PHYSICAL EXAMINATION: General: critically ill appearing male in NAD Neuro: not following commands, spontaneous movement of RUE, RLE, LLE HEENT: ETT in place, small bore feeding tube Cardiovascular: regular, no murmur Lungs: non-labored, coarse bilaterally  , decreased in bases  Abdomen:  Soft, non tender Musculoskeletal: no edema Skin: no rashes  LABS:  BMET  Recent Labs Lab 07/31/16 0836  07/31/16 1548  08/01/16 0706 08/02/16 0302 08/03/16 0335  NA 160*  < > 161*  < > 159* 155* 154*  K 3.8  --  3.4*  --   --  3.5  --   CL 130*  --  129*  --   --  124*  --   CO2 21*  --  23  --   --  24  --   BUN 58*  --  57*  --   --  53*  --   CREATININE 1.43*  --  1.32*  --   --  1.30*  --   GLUCOSE 212*  --  153*  --   --  150*  --   < > = values in this  interval not displayed.  Electrolytes  Recent Labs Lab 07/28/16 0530  07/31/16 0836 07/31/16 1548 08/02/16 0302  CALCIUM 8.8*  < > 8.4* 8.7* 8.3*  MG 2.1  --   --   --  2.5*  PHOS 2.2*  --   --   --  3.9  < > = values in this interval not displayed.  CBC  Recent Labs Lab 07/29/16 0635 07/30/16 0547 08/02/16 0302  WBC 10.4 6.4 10.8*  HGB 10.8* 10.0* 9.3*  HCT 34.1* 32.1* 29.4*  PLT 101* 85* 102*    Coag's No results for input(s): APTT, INR in the last 168 hours.  Sepsis Markers No results for input(s): LATICACIDVEN, PROCALCITON, O2SATVEN in the last 168 hours.  ABG  Recent Labs Lab 07/27/16 1308 07/28/16 0319 08/02/16 0425  PHART 7.500* 7.522* 7.544*  PCO2ART 33.7 27.5* 25.8*  PO2ART 134* 94.9 161*    Liver Enzymes No results for input(s): AST, ALT, ALKPHOS, BILITOT, ALBUMIN in the last 168 hours.  Cardiac Enzymes No results for input(s): TROPONINI, PROBNP in the last 168 hours. Glucose  Recent Labs Lab 08/03/16  0122 08/03/16 0218 08/03/16 0314 08/03/16 0418 08/03/16 0519 08/03/16 0812  GLUCAP 148* 141* 143* 144* 137* 183*   Imaging Dg Chest Port 1 View  Result Date: 08/03/2016 CLINICAL DATA:  Respiratory failure. EXAM: PORTABLE CHEST 1 VIEW COMPARISON:  08/02/2016 FINDINGS: Endotracheal tube terminates approximately 5 cm above the carina. Enteric tube courses into the left upper abdomen with tip not imaged. Cardiomediastinal silhouette is within normal limits. Minimal left basilar opacity likely reflects atelectasis. The lungs are otherwise clear. No sizable pleural effusion or pneumothorax is identified. IMPRESSION: Minimal left basilar atelectasis. Electronically Signed   By: Sebastian Ache M.D.   On: 08/03/2016 08:34   STUDIES:  11/7 ECHO >> mild LVH, EF 65 to 70%, grade 1 DD 11/8 CT Head >> increased hemorrhage and midline shift.  CULTURES: 11/5 BC >> negative  11/5 urine >> negative 11/8 BC >>negative 11/8 Sputum >> MSSA 11/11 C-diff >>  negative  ANTIBIOTICS: Vancomycin 11/09 >> 11/11 Elita Quick 11/09 >> 11/11 Ancef 11/11 >>   SIGNIFICANT EVENTS: 11/04  Admit with AMS, acute hypertensive bleed 11/08  intubation for airway protection  LINES/TUBES: Lt IJ CVL 11/06 >> 11/11 ETT 11/8 >>  DISCUSSION: 66 year old male with hypertensive ICH now with increased bleeding and midline shift. CT can 11/14 increased edema  ASSESSMENT / PLAN:  Acute hypoxic respiratory failure with inability to protect airway in setting of ICH and aspiration pneumonia. - PRVC 8 cc/kg - Wean PEEP / FiO2 for sats >92% - PS trials but no extubation given mental status - Need to decide on trach, need family meeting with neurology, currently not extubatable. Family undecided 11/14 further dialogue needed. - F/u CXR - Day 7 of Abx  ICH with cerebral edema. - Completed induced hypernatremia per neurology 11/5 - Defer Na management to neuro at this point. - CT 11/14 as noted  HTN emergency. Hx of HLD. - Goal SBP < 160 - Continue norvasc, labetalol  Hypokalemia  Hypernatremia  Recent Labs Lab 08/01/16 0706 08/02/16 0302 08/03/16 0335  NA 159* 155* 154*    Recent Labs Lab 07/31/16 0836 07/31/16 1548 08/02/16 0302  K 3.8 3.4* 3.5     - Trend BMP / UOP  - Replace electrolytes as indicated  - Free water added per Neuro   Thrombocytopenia  - Monitor CBC, evidence of bleeding  Nutrition. - Tube feeding per nutrition  DM type II. CBG (last 3)   Recent Labs  08/03/16 0418 08/03/16 0519 08/03/16 0812  GLUCAP 144* 137* 183*     - Insulin gtt  BPH  -Continue flomax, verified with pharmacy can be opened/crushed, females should not touch / wear gloves when handling - I/O cath as needed   DVT prophylaxis - SCDs SUP - Protonix Goals of care - Full code  App CCT 30 min.  Brett Canales Minor ACNP [facility] PCCM Pager [phone] till 3 pm If no answer page [phone]  Attending Note:  64 year old male with hemorrhagic CVA who  is failing to wake up.  On exam, unresponsive, coarse BS diffusely, weaning on and off.  I reviewed CXR myself, ETT ok.  Discussed with Dr. Pearlean Brownie, he does not believe patient will recover form this and recommending DNR and withdraw but wife has not accepted this yet and continues to wish for support.  Dr. Pearlean Brownie will communicate with the wife again today and let us know plan of care.  Hold diureses today.  Replace K.  AM labs.  Continue ancef.  PCCM  will continue to follow.  The patient is critically ill with multiple organ systems failure and requires high complexity decision making for assessment and support, frequent evaluation and titration of therapies, application of advanced monitoring technologies and extensive interpretation of multiple databases.   Critical Care Time devoted to patient care services described in this note is  35  Minutes. This time reflects time of care of this signee Dr Koren BoundWesam Caius Silbernagel. This critical care time does not reflect procedure time, or teaching time or supervisory time of PA/NP/Med student/Med Resident etc but could involve care discussion time.  Alyson ReedyWesam G. Anyla Israelson, M.D. [facility]. Pager: [phone]. After hours pager: [phone].  08/03/2016, 9:01 AM

## 2016-08-04 ENCOUNTER — Inpatient Hospital Stay (HOSPITAL_COMMUNITY): Payer: Medicare Other

## 2016-08-04 LAB — BASIC METABOLIC PANEL
Anion gap: 7 (ref 5–15)
BUN: 47 mg/dL — ABNORMAL HIGH (ref 6–20)
CALCIUM: 8.3 mg/dL — AB (ref 8.9–10.3)
CHLORIDE: 123 mmol/L — AB (ref 101–111)
CO2: 22 mmol/L (ref 22–32)
CREATININE: 1.01 mg/dL (ref 0.61–1.24)
GFR calc Af Amer: >60 mL/min (ref >60)
GFR calc non Af Amer: >60 mL/min (ref >60)
GLUCOSE: 193 mg/dL — AB (ref 65–99)
Potassium: 3.7 mmol/L (ref 3.5–5.1)
Sodium: 152 mmol/L — ABNORMAL HIGH (ref 135–145)

## 2016-08-04 LAB — CBC
HEMATOCRIT: 26.5 % — AB (ref 39.0–52.0)
Hemoglobin: 8.5 g/dL — ABNORMAL LOW (ref 13.0–17.0)
MCH: 29.1 pg (ref 26.0–34.0)
MCHC: 32.1 g/dL (ref 30.0–36.0)
MCV: 90.8 fL (ref 78.0–100.0)
Platelets: 122 10*3/uL — ABNORMAL LOW (ref 150–400)
RBC: 2.92 MIL/uL — ABNORMAL LOW (ref 4.22–5.81)
RDW: 13.2 % (ref 11.5–15.5)
WBC: 9.2 10*3/uL (ref 4.0–10.5)

## 2016-08-04 LAB — PHOSPHORUS: Phosphorus: 2.9 mg/dL (ref 2.5–4.6)

## 2016-08-04 LAB — BLOOD GAS, ARTERIAL
Acid-Base Excess: 1.2 mmol/L (ref 0.0–2.0)
Bicarbonate: 23.3 mmol/L (ref 20.0–28.0)
DRAWN BY: 41977
FIO2: 40
O2 SAT: 99.2 %
PATIENT TEMPERATURE: 98.6
PCO2 ART: 25.3 mmHg — AB (ref 32.0–48.0)
PEEP: 5 cmH2O
PH ART: 7.571 — AB (ref 7.350–7.450)
RATE: 14 resp/min
VT: 620 mL
pO2, Arterial: 138 mmHg — ABNORMAL HIGH (ref 83.0–108.0)

## 2016-08-04 LAB — GLUCOSE, CAPILLARY
GLUCOSE-CAPILLARY: 180 mg/dL — AB (ref 65–99)
GLUCOSE-CAPILLARY: 207 mg/dL — AB (ref 65–99)
Glucose-Capillary: 173 mg/dL — ABNORMAL HIGH (ref 65–99)
Glucose-Capillary: 196 mg/dL — ABNORMAL HIGH (ref 65–99)
Glucose-Capillary: 200 mg/dL — ABNORMAL HIGH (ref 65–99)
Glucose-Capillary: 193 mg/dL — ABNORMAL HIGH (ref 65–99)

## 2016-08-04 LAB — MAGNESIUM: Magnesium: 2.3 mg/dL (ref 1.7–2.4)

## 2016-08-04 NOTE — Care Management Important Message (Signed)
Important Message  Patient Details  Name: Seth Potter MRN: [id number] Date of Birth: May 14, 1951   Medicare Important Message Given:  Other (see comment)    [name] 08/04/2016, 9:45 AM

## 2016-08-04 NOTE — Procedures (Signed)
Electroencephalogram (EEG) Report  Date of study: 08/04/16  Requesting clinician: Antony Contras, MD  Reason for study: evaluate for seizure  Brief clinical history: this is a 65 year old man admitted with a large intracerebral hemorrhage involving the right basal ganglia. He has remained unresponsive and is intubated in the ICU. EEG is performed for further evaluation.  Medications:  Current Facility-Administered Medications:  .   stroke: mapping our early stages of recovery book, , Does not apply, Once, Rogue Jury, MD .  acetaminophen (TYLENOL) solution 650 mg, 650 mg, Per Tube, Q4H PRN, Rosalin Hawking, MD, 650 mg at 08/04/16 0934 .  [DISCONTINUED] acetaminophen (TYLENOL) tablet 650 mg, 650 mg, Oral, Q4H PRN, 650 mg at 07/26/16 0512 **OR** acetaminophen (TYLENOL) suppository 650 mg, 650 mg, Rectal, Q4H PRN, Rogue Jury, MD, 650 mg at 07/25/16 1150 .  amLODipine (NORVASC) tablet 10 mg, 10 mg, Oral, Daily, Rosalin Hawking, MD, 10 mg at 08/04/16 0933 .  chlorhexidine gluconate (MEDLINE KIT) (PERIDEX) 0.12 % solution 15 mL, 15 mL, Mouth Rinse, BID, Rosalin Hawking, MD, 15 mL at 08/04/16 0800 .  dextrose 10 % infusion, , Intravenous, Continuous PRN, Garvin Fila, MD .  feeding supplement (GLUCERNA 1.2 CAL) liquid 1,000 mL, 1,000 mL, Per Tube, Continuous, Rosalin Hawking, MD, Last Rate: 65 mL/hr at 08/04/16 1400, 1,000 mL at 08/04/16 1400 .  feeding supplement (PRO-STAT SUGAR FREE 64) liquid 30 mL, 30 mL, Per Tube, BID, Rosalin Hawking, MD, 30 mL at 08/04/16 0933 .  fentaNYL (SUBLIMAZE) injection 25 mcg, 25 mcg, Intravenous, Q2H PRN, Catha Gosselin, MD, 25 mcg at 08/04/16 1148 .  free water 200 mL, 200 mL, Per Tube, Q4H, Rosalin Hawking, MD, 200 mL at 08/04/16 1552 .  heparin injection 5,000 Units, 5,000 Units, Subcutaneous, Q8H, Rosalin Hawking, MD, 5,000 Units at 08/04/16 1400 .  insulin aspart (novoLOG) injection 2-6 Units, 2-6 Units, Subcutaneous, Q4H, Garvin Fila, MD, 4 Units at 08/04/16 1600 .  insulin  aspart (novoLOG) injection 6 Units, 6 Units, Subcutaneous, Q4H, Garvin Fila, MD, 6 Units at 08/04/16 1600 .  insulin glargine (LANTUS) injection 35 Units, 35 Units, Subcutaneous, Q24H, Garvin Fila, MD, 35 Units at 08/04/16 0537 .  labetalol (NORMODYNE) tablet 200 mg, 200 mg, Oral, TID, Rosalin Hawking, MD, 200 mg at 08/04/16 1552 .  loperamide (IMODIUM) capsule 2 mg, 2 mg, Oral, PRN, Dellia Nims, MD, 2 mg at 08/03/16 1615 .  MEDLINE mouth rinse, 15 mL, Mouth Rinse, 10 times per day, Rosalin Hawking, MD, 15 mL at 08/04/16 1516 .  pantoprazole sodium (PROTONIX) 40 mg/20 mL oral suspension 40 mg, 40 mg, Per Tube, Daily, Chesley Mires, MD, 40 mg at 08/04/16 0934 .  senna-docusate (Senokot-S) tablet 1 tablet, 1 tablet, Oral, BID, Rogue Jury, MD, Stopped at 07/28/16 1037 .  tamsulosin (FLOMAX) capsule 0.4 mg, 0.4 mg, Oral, Daily, Chesley Mires, MD, 0.4 mg at 08/04/16 2993  Description: This is a routine EEG performed using standard international 10-20 electrode placement. A total of 18 channels are recorded, including one for the EKG.  Activating Maneuvers: none  Findings:  The EKG channel demonstrates a regular rhythm with a rate of about 80 beats per minute.   The background demonstrate moderate diffuse generalized slowing. This consists of a combination of theta and delta activity. There is a greater degree of slowing with more delta in the right cerebral hemisphere compared to the left. Voltages are mildly reduced throughout.   The best dominant posterior rhythm on the left is 5-6  hertz. On the right, this is approximately 4 Hz. Reactivity is poor.   No epileptiform discharges are present. No seizures are recorded.      Impression: this is an abnormal EEG due to moderate diffuse generalized slowing. This is consistent with a global encephalopathic process, nonspecific as to etiology. The greater degree of slowing in the right cerebral hemisphere corresponds to known hemorrhage in the right  basal ganglia. No evidence of seizures.   Melba Coon, MD [facility]

## 2016-08-04 NOTE — Progress Notes (Addendum)
STROKE TEAM PROGRESS NOTE   SUBJECTIVE (INTERVAL HISTORY) Afebrile. No family at bedside today. Patient remains unresponsive and intubated. Not following any commands. Family has not yet decided on plan of care CBC:   Recent Labs Lab 07/29/16 0635  08/02/16 0302 08/04/16 0251  WBC 10.4  < > 10.8* 9.2  NEUTROABS 9.2*  --   --   --   HGB 10.8*  < > 9.3* 8.5*  HCT 34.1*  < > 29.4* 26.5*  MCV 92.9  < > 93.0 90.8  PLT 101*  < > 102* 122*  < > = values in this interval not displayed.  Basic Metabolic Panel:   Recent Labs Lab 08/02/16 0302 08/03/16 0335 08/04/16 0251  NA 155* 154* 152*  K 3.5  --  3.7  CL 124*  --  123*  CO2 24  --  22  GLUCOSE 150*  --  193*  BUN 53*  --  47*  CREATININE 1.30*  --  1.01  CALCIUM 8.3*  --  8.3*  MG 2.5*  --  2.3  PHOS 3.9  --  2.9    Lipid Panel:     Component Value Date/Time   CHOL 161 07/25/2016 0228   TRIG 95 07/25/2016 0228   HDL 36 (L) 07/25/2016 0228   CHOLHDL 4.5 07/25/2016 0228   VLDL 19 07/25/2016 0228   LDLCALC 106 (H) 07/25/2016 0228   HgbA1c:  Lab Results  Component Value Date   HGBA1C 8.2 (H) 07/25/2016   Urine Drug Screen:     Component Value Date/Time   LABOPIA NONE DETECTED Jul 23, 2016 0918   COCAINSCRNUR NONE DETECTED Jul 23, 2016 0918   LABBENZ NONE DETECTED Jul 23, 2016 0918   AMPHETMU NONE DETECTED Jul 23, 2016 0918   THCU NONE DETECTED Jul 23, 2016 0918   LABBARB NONE DETECTED Jul 23, 2016 0918      IMAGING I have personally reviewed the radiological images below and agree with the radiology interpretations.  Ct Head Code Stroke W/o Cm 07/29/2016 1. Large acute intraparenchymal hematoma measuring 64 x 53 x 69 mm (estimated volume 115 cc) centered near the right basal ganglia. Localized mass effect with up to 14 mm of right-to-left shift.  2. Associated intraventricular extension with blood in the lateral and third ventricles. Dilatation of the temporal horns of both lateral ventricles without hydrocephalus.   3. Probable subdural extension with tiny right subdural hematoma measuring up to 3 mm.  4. Underlying chronic microvascular ischemic disease.   CT Head WO Contrast - 07/24/16 1. Unchanged size of large right basal ganglia region parenchymal hemorrhage. Slightly increased edema without increased midline shift. 2. Increased intraventricular hemorrhage with mild interval left lateral ventricular dilatation concerning for developing Hydrocephalus.  Ct Head Wo Contrast 07/27/2016 IMPRESSION: Right basal ganglia and frontal parenchymal hemorrhage with intraventricular extension unchanged. Moderate hydrocephalus unchanged. 7 mm midline shift to the left unchanged.   07/29/2016 IMPRESSION: Right hemispheric hemorrhage unchanged. Intraventricular hemorrhage unchanged. 8 mm midline shift to the left unchanged. Mild hydrocephalus unchanged   Dg Chest Port 1 View 07/25/16  1. Tip of the left IJ catheter is in the projection of the SVC. 2. New right lung opacity, which may reflect asymmetric edema or pneumonia.   07/30/2016 1. Stable support apparatus. 2. Opacity in the right base is mildly more prominent. Recommend follow-up to resolution.  07/31/2016 Stable support apparatus. Mild opacity in the right base, similar in the interval.  Recommend follow-up to resolution.  TEE Left ventricle: The cavity size was normal. Wall thickness was  increased in a pattern of mild LVH. Systolic function was   vigorous. The estimated ejection fraction was in the range of 65%   to 70%. Wall motion was normal; there were no regional wall   motion abnormalities. Doppler parameters are consistent with   abnormal left ventricular relaxation (grade 1 diastolic   dysfunction). - Aortic valve: Trileaflet; mildly thickened leaflets. - Left atrium: The atrium was mildly dilated. Impressions: - No cardiac source of emboli was indentified.  CT angiogram brain 08/02/16 : 1. 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 The aneurysm is in close proximity to and directed towards the cerebral hematoma, but not clearly contiguous/causative. 2. Size stable right cerebral hematoma with intraventricular extension. Peripheral edema is increased and midline shift now measures 1 cm. Left lateral ventricle dilatation is stable. 3. Intracranial atherosclerosis bilateral high-grade bilateral P3 segment stenoses. PHYSICAL EXAM  Temp:  [98.3 F (36.8 C)-100.2 F (37.9 C)] 100.2 F (37.9 C) (11/16 0800) Pulse Rate:  [70-86] 86 (11/16 0900) Resp:  [20-31] 31 (11/16 0900) BP: (101-133)/(55-73) 131/64 (11/16 0900) SpO2:  [100 %] 100 % (11/16 0900) FiO2 (%):  [40 %] 40 % (11/16 0745) Weight:  [174 lb 6.1 oz (79.1 kg)] 174 lb 6.1 oz (79.1 kg) (11/16 0343)  General - intubated, sedated. Does not following any commands.   Neuro - intubated, eyes closed, resists eye opening, no respond to voice. Does not follow any commands, PERRL, not blink to visual threat bilaterally, Fundi not visualized. Moving left leg, does not move left arm to even pain, moves right arm and right leg occasionally. Sensation, coordination and gait not tested.    ASSESSMENT/PLAN Seth Potter is a 65 y.o. 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 He did not receive IV t-PA due to ICH.   ICH: Large acute right intraparenchymal hematoma centered near the right basal ganglia, likely due to long-standing HTN and DM. 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  Resultant  Left hemiplegia, drowsiness  CT - Large acute intraparenchymal hematoma.  Repeat CT stable hematoma but increased edema and developing hydrocephalus  CT repeat 07/27/16 no significant change  CT repeat 07/29/16 no significant change  Carotid Doppler - not  ordered  2D Echo - EF 65-70%  LDL - 106  HgbA1c - 8.2  VTE prophylaxis - heparin subq Diet NPO time specified  aspirin 81 mg daily prior to admission, now on No antithrombotic secondary to ICH.  Patient counseled to be compliant with his antithrombotic medications  Ongoing aggressive stroke risk factor management  Therapy recommendations:  pending  Disposition:  Pending  Waiting for family to reach a decision whether to proceed with PEG/Trach vs terminally extubate.   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 Not clear if the hemorrhage was due to ruptured aneurysm vs this is just an incidental finding.  Discussed with Neuro surgery. Angiogram should only be done if family wants to be aggressive. Regardless of whether we do the angiogram, it will not change the  outcome for this patient who already had the large intracranial hemorrhage. Any intervention such as aneurysm clipping or other will be done later if patient makes any neurological improvement. Discussed extensively with the family and they will think about how to proceed.  Respiratory failure  Intubated for airway protection  TRACHEAL ASPIRATE growing MSSA - on ancef  On vent  CCM on board  CXR - opacity right lung base  Fever - could be central fever vs pneumonia  CXR - opacity right lung base  Sputum culture showed MSSA on ancef. Will treat for 7 days total. End date 11/16.  Treat with tylenol.  UA negative   Blood culture NGTD  Diarrhea   Likely due toTF. Will discuss with the nutritionist if we can change this to a less osmolar formulation.  On rectal bag  cdiff negative. Gave imodium starting 11/15.   Hypertension  Stable  BP goal < 160  Off  cleviprex  On amlodipine. Holding lisinopril due to AKI.  on labetalol tp 200mg  tid for BP control  Hyperlipidemia  Home meds:  Fish oil and cinnamon prior to admission  LDL 106 goal < 70  Hold off statin at this time due to large ICH  Diabetes  HgbA1c 8.2, goal < 7.0  Uncontrolled  On insulin drip  SSI  Dysphagia   Did not pass swallow  On TF for nutrition  Other Stroke Risk Factors  Advanced age  Hx of ICH 15 years ago without residue  Other Active Problems  Thrombocytopenia improving.  Anemia stable  - trending slowly. Repeat cbc.   Hypokalemia - replete as needed.   Renal insufficiency - hold lisinopril.   Hyponatremia - was on 3%. Now off but still Na is high. Getting free water  Hospital day # 12 Seth Potter, M.D, PGY-3  I have personally examined this patient, reviewed notes, independently viewed imaging studies, participated in medical decision making and plan of care.ROS completed by me personally and pertinent positives fully documented  I have made any additions or clarifications directly to the above note. Agree with note above. Await family decision on level of care and trach/PEG This patient is critically ill and at significant risk of neurological worsening, death and care requires constant monitoring of vital signs, hemodynamics,respiratory and cardiac monitoring, extensive review of multiple databases, frequent neurological assessment, discussion with family, other specialists and medical decision making of high complexity.I have made any additions or clarifications directly to the above note.This critical care time does not reflect procedure time, or teaching time or supervisory time of PA/NP/Med Resident etc but could involve care discussion time.  I spent 30 minutes of neurocritical care time  in the care of  this patient.      Seth HeadyPramod Quanah Majka, MD Medical Director [facility] Pager: [phone] 08/04/2016 11:59 AM ADDENDUM  : Patient's family was not at the bedside during rounds. Attempted to reach the patient's wife multiple times over the phone but she did not pickup. She eventually called and spoke to the nurse and stated that she had talked to some but not all family members and wanted an EEG to be done to look at how much brain activity the patient was having before she made a decision. Plan to order EEG and discuss with patient's wife tomorrow. Seth HeadyPramod Sameerah Nachtigal, MD 08/04/16 [phone]

## 2016-08-04 NOTE — Progress Notes (Signed)
EEG completed; results pending.    

## 2016-08-04 NOTE — Progress Notes (Signed)
I called wife to ask if the family has made a decision about Seth Potter' care. There was no answer.

## 2016-08-04 NOTE — Progress Notes (Signed)
PULMONARY / CRITICAL CARE MEDICINE   Name: Seth Potter MRN: [id number] DOB: 29-Dec-1950    ADMISSION DATE:  08-19-16 CONSULTATION DATE:  07/27/2016  REFERRING MD:  Dr. Roda Shutters, neurology.  CHIEF COMPLAINT:  Acute respiratory failure, unable to protect airway.  HISTORY OF PRESENT ILLNESS:   65 year old male with an acute hypertensive bleed who presents with AMS and inability to protect his airway.  PCCM consulted for intubation and medical management.  SUBJECTIVE:  No events overnight, weaning but remains unresponsive.  Some spontaneous but non-purposeful movement R side.  Still waiting on family to decide on trach v one-way extubation.    VITAL SIGNS: BP 129/66   Pulse 85   Temp 100.2 F (37.9 C) (Axillary)   Resp (!) 30   Ht 6' (1.829 m) Comment: per wife  Wt 79.1 kg (174 lb 6.1 oz)   SpO2 100%   BMI 23.65 kg/m   VENTILATOR SETTINGS: Vent Mode: PSV;CPAP FiO2 (%):  [40 %] 40 % Set Rate:  [14 bmp] 14 bmp Vt Set:  [620 mL] 620 mL PEEP:  [5 cmH20] 5 cmH20 Pressure Support:  [8 cmH20-10 cmH20] 10 cmH20 Plateau Pressure:  [17 cmH20] 17 cmH20  INTAKE / OUTPUT: I/O last 3 completed shifts: In: 4207.8 [I.V.:67.8; NG/GT:3540; IV Piggyback:600] Out: 3425 [Urine:3425]  PHYSICAL EXAMINATION: General: critically ill appearing male in NAD Neuro: not following commands, spontaneous movement of RUE, RLE, LLE HEENT: ETT in place, small bore feeding tube Cardiovascular: regular, no murmur Lungs: resps even non-labored on PS 10/5 coarse bilaterally, decreased in bases  Abdomen:  Soft, non tender Musculoskeletal: no edema Skin: no rashes  LABS:  BMET  Recent Labs Lab 07/31/16 1548  08/02/16 0302 08/03/16 0335 08/04/16 0251  NA 161*  < > 155* 154* 152*  K 3.4*  --  3.5  --  3.7  CL 129*  --  124*  --  123*  CO2 23  --  24  --  22  BUN 57*  --  53*  --  47*  CREATININE 1.32*  --  1.30*  --  1.01  GLUCOSE 153*  --  150*  --  193*  < > = values in this interval not  displayed.  Electrolytes  Recent Labs Lab 07/31/16 1548 08/02/16 0302 08/04/16 0251  CALCIUM 8.7* 8.3* 8.3*  MG  --  2.5* 2.3  PHOS  --  3.9 2.9    CBC  Recent Labs Lab 07/30/16 0547 08/02/16 0302 08/04/16 0251  WBC 6.4 10.8* 9.2  HGB 10.0* 9.3* 8.5*  HCT 32.1* 29.4* 26.5*  PLT 85* 102* 122*    Coag's No results for input(s): APTT, INR in the last 168 hours.  Sepsis Markers No results for input(s): LATICACIDVEN, PROCALCITON, O2SATVEN in the last 168 hours.  ABG  Recent Labs Lab 08/02/16 0425 08/04/16 0342  PHART 7.544* 7.571*  PCO2ART 25.8* 25.3*  PO2ART 161* 138*    Liver Enzymes No results for input(s): AST, ALT, ALKPHOS, BILITOT, ALBUMIN in the last 168 hours.  Cardiac Enzymes No results for input(s): TROPONINI, PROBNP in the last 168 hours. Glucose  Recent Labs Lab 08/03/16 1138 08/03/16 1536 08/03/16 1939 08/03/16 2329 08/04/16 0321 08/04/16 0809  GLUCAP 223* 185* 161* 206* 180* 173*   Imaging Dg Chest Port 1 View  Result Date: 08/04/2016 CLINICAL DATA:  History of ET tube placement EXAM: PORTABLE CHEST 1 VIEW COMPARISON:  08/03/2016 FINDINGS: The ET tube tip is above the carina. There is a feeding  tube with tip below the GE junction. Normal heart size. Lung volumes are low. No airspace opacities. IMPRESSION: 1. Stable position of ET tube with tip above carina. Electronically Signed   By: Signa Kellaylor  Stroud M.D.   On: 08/04/2016 09:10   STUDIES:  11/7 ECHO >> mild LVH, EF 65 to 70%, grade 1 DD 11/8 CT Head >> increased hemorrhage and midline shift.  CULTURES: 11/5 BC >> negative  11/5 urine >> negative 11/8 BC >>negative 11/8 Sputum >> MSSA 11/11 C-diff >> negative  ANTIBIOTICS: Vancomycin 11/09 >> 11/11 Elita QuickFortaz 11/09 >> 11/11 Ancef 11/11 >>   SIGNIFICANT EVENTS: 11/04  Admit with AMS, acute hypertensive bleed 11/08  intubation for airway protection  LINES/TUBES: Lt IJ CVL 11/06 >> 11/11 ETT 11/8 >>  DISCUSSION: 65 year old  male with hypertensive ICH now with increased bleeding and midline shift. CT can 11/14 increased edema.  Mental status remains poor with poor overall prognosis.   ASSESSMENT / PLAN:  Acute hypoxic respiratory failure with inability to protect airway in setting of ICH and aspiration pneumonia.  - Wean PEEP / FiO2 for sats >92% - PS trials but no extubation given mental status - Need to decide on trach. Family undecided 11/16 after multiple discussions with neurology.  To decide today per RN.  - F/u CXR - Day 8 of Abx  ICH with cerebral edema - poor overall prognosis  - Defer Na management to neuro at this point. - CT 11/14 as noted  HTN emergency. Hx of HLD. - Goal SBP < 160 - Continue norvasc, labetalol  Hypokalemia  Hypernatremia - Trend BMP / UOP  - Replace electrolytes as indicated  - Free water   Thrombocytopenia  - Monitor CBC, evidence of bleeding  Nutrition. - Tube feeding per nutrition  DM type II. - Insulin gtt  BPH  -Continue flomax, verified with pharmacy can be opened/crushed, females should not touch / wear gloves when handling - I/O cath as needed   DVT prophylaxis - SCDs SUP - Protonix Goals of care - Full code.    Discussed with Dr. Pearlean BrownieSethi 11/15, he does not believe patient will recover form this and recommending DNR and withdraw but wife has not accepted this yet and continues to wish for support.    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  On exam, unresponsive, coarse BS diffusely, weaning on and off.  I reviewed CXR myself, ETT in good position.  Discussed with Dr. Pearlean BrownieSethi, he does not believe patient will recover form this and recommending DNR and withdraw but wife has not accepted this yet and continues to wish for support and actually refused to come in the hospital for discussions today.  Dr. Pearlean BrownieSethi will communicate with the  wife again and let us know plan of care.  Hold diureses today.  Replace electrolytes as indicated.  AM labs ordered.  Continue ancef.  Begin PS trials.  PCCM will continue to follow.  The patient is critically ill with multiple organ systems failure and requires high complexity decision making for assessment and support, frequent evaluation and titration of therapies, application of advanced monitoring technologies and extensive interpretation of multiple databases.   Critical Care Time devoted to patient care services described in this note is  35  Minutes. This time reflects time of care of this signee Dr Koren BoundWesam Handsome Anglin. This critical care time  does not reflect procedure time, or teaching time or supervisory time of PA/NP/Med student/Med Resident etc but could involve care discussion time.  Alyson ReedyWesam G. Mylik Pro, M.D. [facility]monary/Critical [facility] Medicine. Pager: [phone]. After hours pager: [phone].

## 2016-08-04 NOTE — Progress Notes (Signed)
Nutrition Follow-up  INTERVENTION:   - Continue Glucerna 1.2 @ 65 mL/hr (1560 mL) with 30 mL Pro-stat BID to provide 2072 kcal, 123 grams protein, and 1265 mL free water daily.  - If diarrhea returns and persists, recommend discontinuing Glucerna 1.2 and Pro-stat and initiating Vital AF 1.2 @ 70 mL/hr (1680 mL) to provide 2016 kcal, 126 grams protein, and 1361 mL free water daily.  NUTRITION DIAGNOSIS:   Inadequate oral intake related to inability to eat as evidenced by NPO status.  Ongoing.  GOAL:   Patient will meet greater than or equal to 90% of their needs  Met with current TF regimen.  MONITOR:   TF tolerance, I & O's, Vent status, GOC  ASSESSMENT:   Pt with hx of HTN and MD who was admitted with unresponsiveness. CT shows large ICH with 14 mm shift.   Per MD, pt having consistent diarrhea most likely related to high osmolality of current TF formula (Glucerna 1.2). However, pt still experiencing high CBG's. Spoke with RN at bedside who reported diarrhea has improved since addition of Imodium. RN also reported possibility of withdrawing care. Per MD note, awaiting family decision on comfort care vs PEG/trach. Will provide recommendations for lower osmolality TF formula if diarrhea persists.  Pt is currently intubated on ventilator support. Glucerna 1.2 infusing @ goal rate of 65 mL/hr at time of visit. No free water flushes programmed in pump. MV: 15.7 L/min Max temperature in 24 hours: 37.9 degrees Celsius BP (MAP): 131/64 (84)  Medications reviewed and include 5,000 units heparin TID, sliding scale Novolog, 35 units Lantus, 40 mg Protonix, Senokot BID, continuous IV insulin, D10 infusion if TF d/c'd  Labs reviewed and include elevated sodium (152 mmol/L), elevated BUN (47 mg/dL), low calcium (8.3 mg/dL), low hemoglobin (8.5 g/dL)   Diet Order:  Diet NPO time specified  Skin:  Reviewed, no issues  Last BM:  08/03/16 rectal tube  Height:   Ht Readings from Last 1  Encounters:  07/25/16 6' (1.829 m)    Weight:   Wt Readings from Last 1 Encounters:  08/04/16 174 lb 6.1 oz (79.1 kg)    Ideal Body Weight:  80.9 kg  BMI:  Body mass index is 23.65 kg/m.  Estimated Nutritional Needs:   Kcal:  2154  Protein:  105-115 grams  Fluid:  > 2 L/day  EDUCATION NEEDS:   No education needs identified at this time  Jeb Levering Dietetic Intern Pager Number: [phone]  Molli Barrows, [place], [place], [place] Pager [phone] After Hours Pager [phone]

## 2016-08-05 ENCOUNTER — Inpatient Hospital Stay (HOSPITAL_COMMUNITY): Payer: Medicare Other

## 2016-08-05 LAB — BLOOD GAS, ARTERIAL
ACID-BASE EXCESS: 1.3 mmol/L (ref 0.0–2.0)
Bicarbonate: 23.9 mmol/L (ref 20.0–28.0)
DRAWN BY: 252031
FIO2: 40
MECHVT: 620 mL
O2 SAT: 99.6 %
PATIENT TEMPERATURE: 98.6
PCO2 ART: 28.7 mmHg — AB (ref 32.0–48.0)
PEEP/CPAP: 5 cmH2O
PH ART: 7.531 — AB (ref 7.350–7.450)
PO2 ART: 167 mmHg — AB (ref 83.0–108.0)
RATE: 14 resp/min

## 2016-08-05 LAB — GLUCOSE, CAPILLARY
GLUCOSE-CAPILLARY: 202 mg/dL — AB (ref 65–99)
GLUCOSE-CAPILLARY: 200 mg/dL — AB (ref 65–99)
GLUCOSE-CAPILLARY: 168 mg/dL — AB (ref 65–99)
Glucose-Capillary: 197 mg/dL — ABNORMAL HIGH (ref 65–99)
Glucose-Capillary: 160 mg/dL — ABNORMAL HIGH (ref 65–99)
Glucose-Capillary: 186 mg/dL — ABNORMAL HIGH (ref 65–99)

## 2016-08-05 LAB — BASIC METABOLIC PANEL
ANION GAP: 9 (ref 5–15)
BUN: 47 mg/dL — ABNORMAL HIGH (ref 6–20)
CALCIUM: 8.5 mg/dL — AB (ref 8.9–10.3)
CO2: 23 mmol/L (ref 22–32)
Chloride: 118 mmol/L — ABNORMAL HIGH (ref 101–111)
Creatinine, Ser: 0.92 mg/dL (ref 0.61–1.24)
GFR calc Af Amer: >60 mL/min (ref >60)
GFR calc non Af Amer: >60 mL/min (ref >60)
GLUCOSE: 200 mg/dL — AB (ref 65–99)
Potassium: 3.8 mmol/L (ref 3.5–5.1)
Sodium: 150 mmol/L — ABNORMAL HIGH (ref 135–145)

## 2016-08-05 LAB — CBC
HEMATOCRIT: 26.3 % — AB (ref 39.0–52.0)
HEMOGLOBIN: 8.5 g/dL — AB (ref 13.0–17.0)
MCH: 29.5 pg (ref 26.0–34.0)
MCHC: 32.3 g/dL (ref 30.0–36.0)
MCV: 91.3 fL (ref 78.0–100.0)
Platelets: 132 10*3/uL — ABNORMAL LOW (ref 150–400)
RBC: 2.88 MIL/uL — ABNORMAL LOW (ref 4.22–5.81)
RDW: 13.6 % (ref 11.5–15.5)
WBC: 10.1 10*3/uL (ref 4.0–10.5)

## 2016-08-05 LAB — PHOSPHORUS: Phosphorus: 3.8 mg/dL (ref 2.5–4.6)

## 2016-08-05 LAB — MAGNESIUM: MAGNESIUM: 2.4 mg/dL (ref 1.7–2.4)

## 2016-08-05 MED ORDER — BETHANECHOL CHLORIDE 10 MG PO TABS
10.0000 mg | ORAL_TABLET | Freq: Three times a day (TID) | ORAL | Status: DC
  Administered 2016-08-05 – 2016-08-08 (×7): 10 mg
  Filled 2016-08-05 (×9): qty 1

## 2016-08-05 NOTE — Progress Notes (Signed)
STROKE TEAM PROGRESS NOTE   SUBJECTIVE (INTERVAL HISTORY) Afebrile. Developing sore due to ETT per RN on the back of his mouth. Wound care consulted. EEG done per family request showing mod diffuse generalized slowing, greater over R cerebral hemisphere with thek nown hemorrhag. No seizures.   Patient remains unresponsive, intubated, no changes since yesterday. Family has not been answering phone calls from the RN to let us know about their decision.    CBC:   Recent Labs Lab 08/04/16 0251 08/05/16 0456  WBC 9.2 10.1  HGB 8.5* 8.5*  HCT 26.5* 26.3*  MCV 90.8 91.3  PLT 122* 132*    Basic Metabolic Panel:   Recent Labs Lab 08/04/16 0251 08/05/16 0456  NA 152* 150*  K 3.7 3.8  CL 123* 118*  CO2 22 23  GLUCOSE 193* 200*  BUN 47* 47*  CREATININE 1.01 0.92  CALCIUM 8.3* 8.5*  MG 2.3 2.4  PHOS 2.9 3.8    Lipid Panel:     Component Value Date/Time   CHOL 161 07/25/2016 0228   TRIG 95 07/25/2016 0228   HDL 36 (L) 07/25/2016 0228   CHOLHDL 4.5 07/25/2016 0228   VLDL 19 07/25/2016 0228   LDLCALC 106 (H) 07/25/2016 0228   HgbA1c:  Lab Results  Component Value Date   HGBA1C 8.2 (H) 07/25/2016   Urine Drug Screen:     Component Value Date/Time   LABOPIA NONE DETECTED 08/14/2016 0918   COCAINSCRNUR NONE DETECTED 07/24/2016 0918   LABBENZ NONE DETECTED 08/10/2016 0918   AMPHETMU NONE DETECTED 07/25/2016 0918   THCU NONE DETECTED 07/27/2016 0918   LABBARB NONE DETECTED 08/12/2016 0918      IMAGING I have personally reviewed the radiological images below and agree with the radiology interpretations.  Ct Head Code Stroke W/o Cm 08/09/2016 1. Large acute intraparenchymal hematoma measuring 64 x 53 x 69 mm (estimated volume 115 cc) centered near the right basal ganglia. Localized mass effect with up to 14 mm of right-to-left shift.  2. Associated intraventricular extension with blood in the lateral and third ventricles. Dilatation of the temporal horns of both  lateral ventricles without hydrocephalus.  3. Probable subdural extension with tiny right subdural hematoma measuring up to 3 mm.  4. Underlying chronic microvascular ischemic disease.   CT Head WO Contrast - 07/24/16 1. Unchanged size of large right basal ganglia region parenchymal hemorrhage. Slightly increased edema without increased midline shift. 2. Increased intraventricular hemorrhage with mild interval left lateral ventricular dilatation concerning for developing Hydrocephalus.  Ct Head Wo Contrast 07/27/2016 IMPRESSION: Right basal ganglia and frontal parenchymal hemorrhage with intraventricular extension unchanged. Moderate hydrocephalus unchanged. 7 mm midline shift to the left unchanged.   07/29/2016 IMPRESSION: Right hemispheric hemorrhage unchanged. Intraventricular hemorrhage unchanged. 8 mm midline shift to the left unchanged. Mild hydrocephalus unchanged   Dg Chest Port 1 View 07/25/16  1. Tip of the left IJ catheter is in the projection of the SVC. 2. New right lung opacity, which may reflect asymmetric edema or pneumonia.   07/30/2016 1. Stable support apparatus. 2. Opacity in the right base is mildly more prominent. Recommend follow-up to resolution.  07/31/2016 Stable support apparatus. Mild opacity in the right base, similar in the interval.  Recommend follow-up to resolution.  TEE Left ventricle: The cavity size was normal. Wall thickness was   increased in a pattern of mild LVH. Systolic function was   vigorous. The estimated ejection fraction was in the range of 65%   to 70%. Wall  motion was normal; there were no regional wall   motion abnormalities. Doppler parameters are consistent with   abnormal left ventricular relaxation (grade 1 diastolic   dysfunction). - Aortic valve: Trileaflet; mildly thickened leaflets. - Left atrium: The atrium was mildly dilated. Impressions: - No cardiac source of emboli was indentified.  CT angiogram brain 08/02/16 : 1.  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 The aneurysm is in close proximity to and directed towards the cerebral hematoma, but not clearly contiguous/causative. 2. Size stable right cerebral hematoma with intraventricular extension. Peripheral edema is increased and midline shift now measures 1 cm. Left lateral ventricle dilatation is stable. 3. Intracranial atherosclerosis bilateral high-grade bilateral P3 segment stenoses. PHYSICAL EXAM  Temp:  [98.4 F (36.9 C)-99.9 F (37.7 C)] 98.7 F (37.1 C) (11/17 0326) Pulse Rate:  [72-86] 75 (11/17 0700) Resp:  [21-32] 21 (11/17 0700) BP: (108-132)/(56-82) 119/64 (11/17 0700) SpO2:  [97 %-100 %] 100 % (11/17 0752) FiO2 (%):  [40 %] 40 % (11/17 0752) Weight:  [166 lb 3.6 oz (75.4 kg)] 166 lb 3.6 oz (75.4 kg) (11/17 0500)  General - intubated, sedated. Does not following any commands.   Neuro - intubated, eyes closed, resists eye opening, no respond to voice. Does not follow any commands, PERRL, not blink to visual threat bilaterally, Fundi not visualized. Moves right arm and right leg occasionally. Withdraws to pain on left leg. No movement of left arm. Sensation, coordination and gait not tested.    ASSESSMENT/PLAN [name] is a 65 y.o. 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 He did not receive IV t-PA due to ICH.   ICH: Large acute right intraparenchymal hematoma centered near the right basal ganglia, likely due to long-standing HTN and DM. 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  Resultant  Left hemiplegia, drowsiness  CT - Large acute intraparenchymal hematoma.  Repeat CT stable hematoma but increased edema and developing hydrocephalus  CT repeat 07/27/16 no significant change  CT repeat 07/29/16 no  significant change  Carotid Doppler - not ordered  2D Echo - EF 65-70%  LDL - 106  HgbA1c - 8.2  VTE prophylaxis - heparin subq Diet NPO time specified  aspirin 81 mg daily prior to admission, now on No antithrombotic secondary to ICH.  Patient counseled to be compliant with his antithrombotic medications  Ongoing aggressive stroke risk factor management  Therapy recommendations:  pending  Disposition:  Pending  Waiting for family to reach a decision whether to proceed with PEG/Trach vs terminally extubate. Today is his 10th day on ETT.  EEG was done per family request to know how much brain activity remains. EEG Showed moderate diffuse generalized slowing.   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 Not clear if the hemorrhage was due to ruptured aneurysm vs this is just an incidental finding.   Discussed with Neuro surgery. Angiogram should only be done if family wants to be aggressive. Regardless of whether we do the angiogram, it will not change  the outcome for this patient who already had the large intracranial hemorrhage. Any intervention such as aneurysm clipping or other will be done later if patient makes any neurological improvement. Discussed extensively with the family and they will think about how to proceed.  Respiratory failure  Intubated for airway protection  TRACHEAL ASPIRATE growing MSSA - on ancef  On vent  CCM on board  CXR - opacity right lung base  Fever - could be central fever vs pneumonia  CXR - opacity right lung base  Sputum culture showed MSSA on ancef. Will treat for 7 days total. End date 11/16.  Treat with tylenol.  UA negative   Blood culture NGTD  Diarrhea   Likely due toTF. Will  discuss with the nutritionist if we can change this to a less osmolar formulation.  On rectal bag  cdiff negative. Gave imodium starting 11/15.   Hypertension  Stable  BP goal < 160  Off cleviprex  On amlodipine. Holding lisinopril due to AKI.  on labetalol tp 200mg  tid for BP control  Hyperlipidemia  Home meds:  Fish oil and cinnamon prior to admission  LDL 106 goal < 70  Hold off statin at this time due to large ICH  Diabetes  HgbA1c 8.2, goal < 7.0  Uncontrolled  On insulin drip  SSI  Dysphagia   Did not pass swallow  On TF for nutrition  Other Stroke Risk Factors  Advanced age  Hx of ICH 15 years ago without residue  Other Active Problems  Thrombocytopenia improving.  Anemia stable  - trending slowly. Repeat cbc.   Hypokalemia - replete as needed.   Renal insufficiency - hold lisinopril.   Hyponatremia - was on 3%. Now off but still Na is high. Getting free water  Hospital day # 13 Hyacinth Meekerasrif Ahmed, M.D, PGY-3  I have personally examined this patient, reviewed notes, independently viewed imaging studies, participated in medical decision making and plan of care.ROS completed by me personally and pertinent positives fully documented  I have made any additions or clarifications directly to the above note. Agree with note above. I spoke to the patient's sister-in-law at the bedside and explained the poor prognosis and need to do tracheostomy and PEG tube his wife decides to pursue ongoing care. Patient's wife is struggling with decision and hopefully will make decision soon. I offered to come back and speak to other family members and wife when they come. This patient is critically ill and at significant risk of neurological worsening, death and care requires constant monitoring of vital signs, hemodynamics,respiratory and cardiac monitoring, extensive review of multiple databases, frequent neurological assessment, discussion with family, other specialists  and medical decision making of high complexity.I have made any additions or clarifications directly to the above note.This critical care time does not reflect procedure time, or teaching time or supervisory time of PA/NP/Med Resident etc but could involve care discussion time.  I spent 30 minutes of neurocritical care time  in the care of  this patient.    Delia Heady.  Rashunda Passon, MD Medical Director [facility] Pager: [phone] 08/05/2016 3:56 PM

## 2016-08-05 NOTE — Progress Notes (Signed)
PULMONARY / CRITICAL CARE MEDICINE   Name: Seth Potter MRN: [id number] DOB: 1951-08-04    ADMISSION DATE:  07/27/2016 CONSULTATION DATE:  07/27/2016  REFERRING MD:  Dr. Roda ShuttersXu, neurology.  CHIEF COMPLAINT:  Acute respiratory failure, unable to protect airway.  HISTORY OF PRESENT ILLNESS:   65 year old male with an acute hypertensive bleed who presents with AMS and inability to protect his airway.  PCCM consulted for intubation and medical management.  SUBJECTIVE:  No events overnight. Still waiting for response from wife regarding goals of care.  VITAL SIGNS: BP 133/62   Pulse 80   Temp (!) 100.6 F (38.1 C) (Axillary)   Resp (!) 24   Ht 6' (1.829 m) Comment: per wife  Wt 166 lb 3.6 oz (75.4 kg)   SpO2 100%   BMI 22.54 kg/m   VENTILATOR SETTINGS: Vent Mode: PSV;CPAP FiO2 (%):  [40 %] 40 % Set Rate:  [14 bmp] 14 bmp Vt Set:  [620 mL] 620 mL PEEP:  [5 cmH20] 5 cmH20 Pressure Support:  [10 cmH20] 10 cmH20 Plateau Pressure:  [14 cmH20-15 cmH20] 14 cmH20  INTAKE / OUTPUT: I/O last 3 completed shifts: In: 4260 [NG/GT:4060; IV Piggyback:200] Out: 3411 [Urine:3410; Stool:1]  PHYSICAL EXAMINATION: General: critically ill appearing male in no distress Neuro: Opens eyes to stimuli. spontaneous movement of RUE, RLE, LLE HEENT: ETT in place,  Cardiovascular: regular, no MRG Lungs: resps even non-labored on PS 10/5 coarse bilaterally, decreased in bases  Abdomen:  Soft, non tender Musculoskeletal: no edema Skin: no rashes  LABS:  BMET  Recent Labs Lab 08/02/16 0302 08/03/16 0335 08/04/16 0251 08/05/16 0456  NA 155* 154* 152* 150*  K 3.5  --  3.7 3.8  CL 124*  --  123* 118*  CO2 24  --  22 23  BUN 53*  --  47* 47*  CREATININE 1.30*  --  1.01 0.92  GLUCOSE 150*  --  193* 200*    Electrolytes  Recent Labs Lab 08/02/16 0302 08/04/16 0251 08/05/16 0456  CALCIUM 8.3* 8.3* 8.5*  MG 2.5* 2.3 2.4  PHOS 3.9 2.9 3.8    CBC  Recent Labs Lab 08/02/16 0302  08/04/16 0251 08/05/16 0456  WBC 10.8* 9.2 10.1  HGB 9.3* 8.5* 8.5*  HCT 29.4* 26.5* 26.3*  PLT 102* 122* 132*    Coag's No results for input(s): APTT, INR in the last 168 hours.  Sepsis Markers No results for input(s): LATICACIDVEN, PROCALCITON, O2SATVEN in the last 168 hours.  ABG  Recent Labs Lab 08/02/16 0425 08/04/16 0342 08/05/16 0338  PHART 7.544* 7.571* 7.531*  PCO2ART 25.8* 25.3* 28.7*  PO2ART 161* 138* 167*    Liver Enzymes No results for input(s): AST, ALT, ALKPHOS, BILITOT, ALBUMIN in the last 168 hours.  Cardiac Enzymes No results for input(s): TROPONINI, PROBNP in the last 168 hours. Glucose  Recent Labs Lab 08/04/16 1159 08/04/16 1559 08/04/16 1933 08/04/16 2339 08/05/16 0258 08/05/16 0742  GLUCAP 196* 200* 193* 207* 202* 200*   Imaging Dg Chest Portable 1 View  Result Date: 08/05/2016 CLINICAL DATA:  Followup exam.  Intubated patient. EXAM: PORTABLE CHEST 1 VIEW COMPARISON:  08/04/2016 FINDINGS: Cardiac silhouette is normal in size. No mediastinal or hilar masses. Clear lungs.  No pleural effusion.  No pneumothorax. Endotracheal tube is stable, tip projecting 4.5 cm above the carina. Enteric tube passes below the diaphragm and below the included field of view. IMPRESSION: 1. No acute cardiopulmonary disease. 2. Stable endotracheal tube and enteric tube.  Electronically Signed   By: Amie Portlandavid  Ormond M.D.   On: 08/05/2016 09:47   STUDIES:  11/7 ECHO >> mild LVH, EF 65 to 70%, grade 1 DD 11/8 CT Head >> increased hemorrhage and midline shift.  CULTURES: 11/5 BC >> negative  11/5 urine >> negative 11/8 BC >>negative 11/8 Sputum >> MSSA 11/11 C-diff >> negative  ANTIBIOTICS: Vancomycin 11/09 >> 11/11 Elita QuickFortaz 11/09 >> 11/11 Ancef 11/11 >> 11/16  SIGNIFICANT EVENTS: 11/04  Admit with AMS, acute hypertensive bleed 11/08  intubation for airway protection  LINES/TUBES: Lt IJ CVL 11/06 >> 11/11 ETT 11/8 >>  DISCUSSION: 65 year old male with  hypertensive ICH now with increased bleeding and midline shift. CT can 11/14 increased edema.  Mental status remains poor with poor overall prognosis.   ASSESSMENT / PLAN:  Acute hypoxic respiratory failure with inability to protect airway in setting of ICH and aspiration pneumonia.  - Wean PEEP / FiO2 for sats >92% - PS trials but no extubation given mental status - Need to decide on trach. Family undecided 11/16 after multiple discussions with neurology.   - F/u CXR - Finished abx course.   ICH with cerebral edema - poor overall prognosis  - Defer Na management to neuro at this point. - CT 11/14 as noted  HTN emergency. Hx of HLD. - Goal SBP < 160 - Continue norvasc, labetalol  Hypokalemia  Hypernatremia - Trend BMP / UOP  - Replace electrolytes as indicated  - Free water   Thrombocytopenia  - Monitor CBC, evidence of bleeding  Nutrition. - Tube feeding per nutrition  DM type II. - Insulin gtt  BPH  -Continue flomax, verified with pharmacy can be opened/crushed, females should not touch / wear gloves when handling - I/O cath as needed   DVT prophylaxis - SCDs SUP - Protonix Goals of care - Full code.    Discussed with Dr. Pearlean BrownieSethi 11/17, he does not believe patient will recover form this and recommending DNR and withdraw but wife has not accepted this yet and continues to wish for support.  Ongoing discussions with neuro regarding prognosis. May need trach next week if we are to continue current level of care.   Critical care time- 35 mins.  Chilton GreathousePraveen Trevell Pariseau MD [facility] Pager [phone] If no answer or after 3pm call: [phone] 08/05/2016, 10:07 AM

## 2016-08-05 NOTE — Progress Notes (Addendum)
Inpatient Diabetes Program Recommendations  AACE/ADA: New Consensus Statement on Inpatient Glycemic Control (2015)  Target Ranges:  Prepandial:   less than 140 mg/dL      Peak postprandial:   less than 180 mg/dL (1-2 hours)      Critically ill patients:  140 - 180 mg/dL   Results for Seth Potter, Seth Potter (MRN [id number]) as of 08/05/2016 11:03  Ref. Range 08/04/2016 03:21 08/04/2016 08:09 08/04/2016 11:59 08/04/2016 15:59 08/04/2016 19:33 08/04/2016 23:39 08/05/2016 02:58 08/05/2016 07:42  Glucose-Capillary Latest Ref Range: 65 - 99 mg/dL 180 (H) 173 (H) 196 (H) 200 (H) 193 (H) 207 (H) 202 (H) 200 (H)   Review of Glycemic Control  Current orders for Inpatient glycemic control: Lantus 35 units Q24H, Novolog 6 units Q4H for tube feeding coverage, Novolog 2-6 units Q4H  Inpatient Diabetes Program Recommendations:  ICU Glycemic Control: NURSING: Per ICU Glycemic Control order set If there is one CBG reading > 250 mg/dL or two subsequent CBG readings > 200 mg/dL switch to ICU Glycemic Control Order Set (Phase II). Please follow protocol when parameters are met. Insulin - Basal: Please consider increasing Lantus to 40 units Q24H. Insulin - Tube Feeding Coverage: Please consider increasing tube feeding coverage to Novolog to 9 units Q4H.  NOTE: Patient is ordered ICU Glycemic Control Phase 3 order set. Patient had 2 CBGs greater than 200 mg/dl over the past 12 hours but patient was not transitioned back to Phase 2 (IV insulin) per protocol and remains on Phase 3 SQ insulin. NURSING: Please follow ICU Glycemic Control protocol and transition to Phase 2 (IV insulin) if parameters are met.    Thanks, Barnie Alderman, RN, MSN, CDE Diabetes Coordinator Inpatient Diabetes Program [phone] (Team Pager from 8am to 5pm)

## 2016-08-05 NOTE — Progress Notes (Signed)
eLink Physician-Brief Progress Note Patient Name: Seth PerfectRichard M Potter DOB: 08-Mar-1951 MRN: [id number]   Date of Service  08/05/2016  HPI/Events of Note  Flomax ordered. Patient currently nothing by mouth on ventilator endotracheal intubated. Has NG tube in place.   eICU Interventions  Switched to Urecholine 10 mg via tube 3 times a day      Intervention Category Minor Interventions: Other:  Seth CousinsJennings Potter 08/05/2016, 10:15 PM

## 2016-08-05 NOTE — Consult Note (Signed)
WOC Nurse wound consult note Reason for Consult: possible pressure ulcer inside right side of mouth Wound type: unable to visualize wound without moving ET tube. Nurse reports there is a area to the mucosa on right side of his cheek. The nurse states the respiratory therapist is having difficulty rotating the ET tube because of the patient's tongue. Dressing procedure/placement/frequency: no topical treatment recommended.  We asked that nursing assess the area when respiratory therapy performs care and attempts to rotate the ET tube.  WOC nurse will follow for weekly wound assessment   Durel SaltsKeatah Brooks [employer] student   Discussed POC with patient's family and bedside nurse.  Thanks  Margree Gimbel [employer] MSN, RN,CWOCN, CNS [phone]

## 2016-08-06 DIAGNOSIS — I10 Essential (primary) hypertension: Secondary | ICD-10-CM

## 2016-08-06 DIAGNOSIS — I610 Nontraumatic intracerebral hemorrhage in hemisphere, subcortical: Secondary | ICD-10-CM

## 2016-08-06 LAB — GLUCOSE, CAPILLARY
GLUCOSE-CAPILLARY: 184 mg/dL — AB (ref 65–99)
GLUCOSE-CAPILLARY: 185 mg/dL — AB (ref 65–99)
Glucose-Capillary: 180 mg/dL — ABNORMAL HIGH (ref 65–99)
Glucose-Capillary: 183 mg/dL — ABNORMAL HIGH (ref 65–99)
Glucose-Capillary: 193 mg/dL — ABNORMAL HIGH (ref 65–99)
Glucose-Capillary: 206 mg/dL — ABNORMAL HIGH (ref 65–99)

## 2016-08-06 LAB — SODIUM: SODIUM: 150 mmol/L — AB (ref 135–145)

## 2016-08-06 NOTE — Progress Notes (Signed)
STROKE TEAM PROGRESS NOTE   SUBJECTIVE (INTERVAL HISTORY) No changes.Afebrile.  . EEG done per family request showing mod diffuse generalized slowing, greater over R cerebral hemisphere with thek nown hemorrhag. No seizures.   Patient remains unresponsive, intubated, no changes since yesterday.I met with wife and son y`day afternoon and explained poor prognosis and need to make decision about trach/PEG versus comfort care but they are unable to decide at present time..    CBC:   Recent Labs Lab 08/04/16 0251 08/05/16 0456  WBC 9.2 10.1  HGB 8.5* 8.5*  HCT 26.5* 26.3*  MCV 90.8 91.3  PLT 122* 132*    Basic Metabolic Panel:   Recent Labs Lab 08/04/16 0251 08/05/16 0456 08/06/16 0307  NA 152* 150* 150*  K 3.7 3.8  --   CL 123* 118*  --   CO2 22 23  --   GLUCOSE 193* 200*  --   BUN 47* 47*  --   CREATININE 1.01 0.92  --   CALCIUM 8.3* 8.5*  --   MG 2.3 2.4  --   PHOS 2.9 3.8  --     Lipid Panel:     Component Value Date/Time   CHOL 161 07/25/2016 0228   TRIG 95 07/25/2016 0228   HDL 36 (L) 07/25/2016 0228   CHOLHDL 4.5 07/25/2016 0228   VLDL 19 07/25/2016 0228   LDLCALC 106 (H) 07/25/2016 0228   HgbA1c:  Lab Results  Component Value Date   HGBA1C 8.2 (H) 07/25/2016   Urine Drug Screen:     Component Value Date/Time   LABOPIA NONE DETECTED 08/17/2016 0918   COCAINSCRNUR NONE DETECTED 08/17/2016 0918   LABBENZ NONE DETECTED 07/27/2016 0918   AMPHETMU NONE DETECTED 07/29/2016 0918   THCU NONE DETECTED 08/02/2016 0918   LABBARB NONE DETECTED 07/26/2016 0918      IMAGING I have personally reviewed the radiological images below and agree with the radiology interpretations.  Ct Head Code Stroke W/o Cm 08/14/2016 1. Large acute intraparenchymal hematoma measuring 64 x 53 x 69 mm (estimated volume 115 cc) centered near the right basal ganglia. Localized mass effect with up to 14 mm of right-to-left shift.  2. Associated intraventricular extension with blood  in the lateral and third ventricles. Dilatation of the temporal horns of both lateral ventricles without hydrocephalus.  3. Probable subdural extension with tiny right subdural hematoma measuring up to 3 mm.  4. Underlying chronic microvascular ischemic disease.   CT Head WO Contrast - 07/24/16 1. Unchanged size of large right basal ganglia region parenchymal hemorrhage. Slightly increased edema without increased midline shift. 2. Increased intraventricular hemorrhage with mild interval left lateral ventricular dilatation concerning for developing Hydrocephalus.  Ct Head Wo Contrast 07/27/2016 IMPRESSION: Right basal ganglia and frontal parenchymal hemorrhage with intraventricular extension unchanged. Moderate hydrocephalus unchanged. 7 mm midline shift to the left unchanged.   07/29/2016 IMPRESSION: Right hemispheric hemorrhage unchanged. Intraventricular hemorrhage unchanged. 8 mm midline shift to the left unchanged. Mild hydrocephalus unchanged   Dg Chest Port 1 View 07/25/16  1. Tip of the left IJ catheter is in the projection of the SVC. 2. New right lung opacity, which may reflect asymmetric edema or pneumonia.   07/30/2016 1. Stable support apparatus. 2. Opacity in the right base is mildly more prominent. Recommend follow-up to resolution.  07/31/2016 Stable support apparatus. Mild opacity in the right base, similar in the interval.  Recommend follow-up to resolution.  TEE Left ventricle: The cavity size was normal. Wall thickness was  increased in a pattern of mild LVH. Systolic function was   vigorous. The estimated ejection fraction was in the range of 65%   to 70%. Wall motion was normal; there were no regional wall   motion abnormalities. Doppler parameters are consistent with   abnormal left ventricular relaxation (grade 1 diastolic   dysfunction). - Aortic valve: Trileaflet; mildly thickened leaflets. - Left atrium: The atrium was mildly dilated. Impressions: - No  cardiac source of emboli was indentified.  CT angiogram brain 08/02/16 : 1. 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 The aneurysm is in close proximity to and directed towards the cerebral hematoma, but not clearly contiguous/causative. 2. Size stable right cerebral hematoma with intraventricular extension. Peripheral edema is increased and midline shift now measures 1 cm. Left lateral ventricle dilatation is stable. 3. Intracranial atherosclerosis bilateral high-grade bilateral P3 segment stenoses. PHYSICAL EXAM  Temp:  [98.1 F (36.7 C)-100.8 F (38.2 C)] 100.1 F (37.8 C) (11/18 0800) Pulse Rate:  [70-84] 79 (11/18 1000) Resp:  [16-28] 24 (11/18 1000) BP: (116-146)/(55-72) 138/72 (11/18 1000) SpO2:  [100 %] 100 % (11/18 1000) FiO2 (%):  [40 %] 40 % (11/18 0806) Weight:  [168 lb 14 oz (76.6 kg)] 168 lb 14 oz (76.6 kg) (11/18 0353)  General - intubated, sedated. Does not following any commands.   Neuro - intubated, eyes closed, resists eye opening, no respond to voice. Does not follow any commands, PERRL, not blink to visual threat bilaterally, Fundi not visualized. Moves right arm and right leg occasionally. Withdraws to pain on left leg. No movement of left arm. Sensation, coordination and gait not tested.    ASSESSMENT/PLAN Mr. CORTEZ FLIPPEN is a 65 y.o. 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 He did not receive IV t-PA due to [place].   ICH: Large acute right intraparenchymal hematoma centered near the right basal ganglia, likely due to long-standing HTN and DM. 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  Resultant  Left hemiplegia, drowsiness  CT - Large acute intraparenchymal hematoma.  Repeat CT stable hematoma but increased edema and developing  hydrocephalus  CT repeat 07/27/16 no significant change  CT repeat 07/29/16 no significant change  Carotid Doppler - not ordered  2D Echo - EF 65-70%  LDL - 106  HgbA1c - 8.2  VTE prophylaxis - heparin subq Diet NPO time specified  aspirin 81 mg daily prior to admission, now on No antithrombotic secondary to [place]  Patient counseled to be compliant with his antithrombotic medications  Ongoing aggressive stroke risk factor management  Therapy recommendations:  pending  Disposition:  Pending  Waiting for family to reach a decision whether to proceed with PEG/Trach vs terminally extubate. Today is his 10th day on ETT.  EEG was done per family request to know how much brain activity remains. EEG Showed moderate diffuse generalized slowing.   Cerebral edema  CT showed Large ICH with midline shift  CT repeat 07/27/16 and 07/29/16 no significant change  Off 3% saline   Central line d/c'ed  Na Goal 150-160  Na now 159  On free water now  361mlobulated aneurysm at R MCA bifurct which is near the area of hemorrhage. Not clear if the hemorrhage was due to ruptured aneurysm vs this is just an incidental finding.  Discussed with Neuro surgery. Angiogram should only be done if family wants to be aggressive. Regardless of whether we do the angiogram, it will not change the outcome for this patient who already had the large intracranial hemorrhage. Any intervention such as aneurysm clipping or other will be done later if patient makes any neurological improvement. Discussed extensively with the family and they will think about how to proceed.  Respiratory failure  Intubated for airway protection  TRACHEAL ASPIRATE growing MSSA - on ancef  On vent  CCM on board  CXR - opacity right lung base  Fever - could be central fever vs pneumonia  CXR - opacity right lung base  Sputum culture showed MSSA on ancef. Will treat for 7 days total. End date 11/16.  Treat with  tylenol.  UA negative   Blood culture NGTD  Diarrhea   Likely due toTF. Will discuss with the nutritionist if we can change this to a less osmolar formulation.  On rectal bag  cdiff negative. Gave imodium starting 11/15.   Hypertension  Stable  BP goal < 160  Off cleviprex  On amlodipine. Holding lisinopril due to AKI.  on labetalol tp 275m tid for BP control  Hyperlipidemia  Home meds:  Fish oil and cinnamon prior to admission  LDL 106 goal < 70  Hold off statin at this time due to large ICH  Diabetes  HgbA1c 8.2, goal < 7.0  Uncontrolled  On insulin drip  SSI  Dysphagia   Did not pass swallow  On TF for nutrition  Other Stroke Risk Factors  Advanced age  Hx of ICH 15 years ago without residue  Other Active Problems  Thrombocytopenia improving.  Anemia stable  - trending slowly. Repeat cbc.   Hypokalemia - replete as needed.   Renal insufficiency - hold lisinopril.   Hyponatremia - was on 3%. Now off but still Na is high. Getting free water  Hospital day # 14    I have personally examined this patient, reviewed notes, independently viewed imaging studies, participated in medical decision making and plan of care.ROS completed by me personally and pertinent positives fully documented  I have made any additions or clarifications directly to the above note.   I spoke to the patient's  son and wife on 08/05/16 after rounds at the bedside and explained the poor prognosis and need to do tracheostomy and PEG tube his wife decides to pursue ongoing care. Patient's wife is struggling with decision and hopefully will make decision soon. I offered to come back and speak to other family members over the weekend and wife when they come. This patient is critically ill and at significant risk of neurological worsening, death and care requires constant monitoring of vital signs, hemodynamics,respiratory and cardiac monitoring, extensive review of multiple  databases, frequent neurological assessment, discussion with family, other specialists and medical decision making of high complexity.I have made any additions or clarifications directly to the above note.This critical care time does not reflect procedure time, or teaching time or supervisory time of PA/NP/Med Resident etc but could involve care discussion time.  I spent 30 minutes of neurocritical care time  in the care of  this patient.    .Antony Contras MD Medical Director [facility] Pager: [phone]/18/2017 10:49 AM

## 2016-08-06 NOTE — Progress Notes (Signed)
PULMONARY / CRITICAL CARE MEDICINE   Name: Seth Potter MRN: [id number] DOB: September 26, 1950    ADMISSION DATE:  08/15/2016 CONSULTATION DATE:  07/27/2016  REFERRING MD:  Dr. Erlinda Hong, neurology.  CHIEF COMPLAINT:  Acute respiratory failure, unable to protect airway.  HISTORY OF PRESENT ILLNESS:   65 year old male with an acute hypertensive bleed who presents with AMS and inability to protect his airway.  PCCM consulted for intubation and medical management.  SUBJECTIVE:  No events overnight. Remains unresponsive .   VITAL SIGNS: BP (!) 141/63 (BP Location: Right Arm)   Pulse 71   Temp 100.1 F (37.8 C) (Axillary)   Resp (!) 21   Ht 6' (1.829 m) Comment: per wife  Wt 76.6 kg (168 lb 14 oz)   SpO2 100%   BMI 22.90 kg/m   VENTILATOR SETTINGS: Vent Mode: PSV;CPAP FiO2 (%):  [40 %] 40 % Set Rate:  [14 bmp] 14 bmp Vt Set:  [620 mL] 620 mL PEEP:  [5 cmH20] 5 cmH20 Pressure Support:  [10 cmH20] 10 cmH20 Plateau Pressure:  [14 cmH20-16 cmH20] 14 cmH20  INTAKE / OUTPUT: I/O last 3 completed shifts: In: 8299 [NG/GT:3410] Out: 3606 [Urine:3605; Stool:1]  PHYSICAL EXAMINATION: General: critically ill appearing male in no distress Neuro: Opens eyes to stimuli. spontaneous movement of RUE, RLE, LLE HEENT: ETT in place,  Cardiovascular: regular, no MRG Lungs: resps even non-labored on PS 10/5 coarse bilaterally, decreased in bases  Abdomen:  Soft, non tender Musculoskeletal: no edema Skin: no rashes  LABS:  BMET  Recent Labs Lab 08/02/16 0302  08/04/16 0251 08/05/16 0456 08/06/16 0307  NA 155*  < > 152* 150* 150*  K 3.5  --  3.7 3.8  --   CL 124*  --  123* 118*  --   CO2 24  --  22 23  --   BUN 53*  --  47* 47*  --   CREATININE 1.30*  --  1.01 0.92  --   GLUCOSE 150*  --  193* 200*  --   < > = values in this interval not displayed.  Electrolytes  Recent Labs Lab 08/02/16 0302 08/04/16 0251 08/05/16 0456  CALCIUM 8.3* 8.3* 8.5*  MG 2.5* 2.3 2.4  PHOS 3.9 2.9  3.8    CBC  Recent Labs Lab 08/02/16 0302 08/04/16 0251 08/05/16 0456  WBC 10.8* 9.2 10.1  HGB 9.3* 8.5* 8.5*  HCT 29.4* 26.5* 26.3*  PLT 102* 122* 132*    Coag's No results for input(s): APTT, INR in the last 168 hours.  Sepsis Markers No results for input(s): LATICACIDVEN, PROCALCITON, O2SATVEN in the last 168 hours.  ABG  Recent Labs Lab 08/02/16 0425 08/04/16 0342 08/05/16 0338  PHART 7.544* 7.571* 7.531*  PCO2ART 25.8* 25.3* 28.7*  PO2ART 161* 138* 167*    Liver Enzymes No results for input(s): AST, ALT, ALKPHOS, BILITOT, ALBUMIN in the last 168 hours.  Cardiac Enzymes No results for input(s): TROPONINI, PROBNP in the last 168 hours. Glucose  Recent Labs Lab 08/05/16 1137 08/05/16 1541 08/05/16 1952 08/05/16 2319 08/06/16 0317 08/06/16 0800  GLUCAP 197* 168* 160* 186* 184* 185*   Imaging No results found. STUDIES:  11/7 ECHO >> mild LVH, EF 65 to 70%, grade 1 DD 11/8 CT Head >> increased hemorrhage and midline shift.  CULTURES: 11/5 BC >> negative  11/5 urine >> negative 11/8 BC >>negative 11/8 Sputum >> MSSA 11/11 C-diff >> negative  ANTIBIOTICS: Vancomycin 11/09 >> 11/11 Tressie Ellis 11/09 >>  11/11 Ancef 11/11 >> 11/16  SIGNIFICANT EVENTS: 11/04  Admit with AMS, acute hypertensive bleed 11/08  intubation for airway protection  LINES/TUBES: Lt IJ CVL 11/06 >> 11/11 ETT 11/8 >>  DISCUSSION: 65 year old male with hypertensive ICH now with increased bleeding and midline shift. CT can 11/14 increased edema.  Mental status remains poor with poor overall prognosis.   ASSESSMENT / PLAN:  Acute hypoxic respiratory failure with inability to protect airway in setting of ICH and aspiration pneumonia.  - Wean PEEP / FiO2 for sats >92% - PS trials but no extubation given mental status - Need to decide on trach. Family undecided  At this time  - Finished abx course.   ICH with cerebral edema - poor overall prognosis  - Defer Na management to  neuro at this point. - CT 11/14 as noted  HTN emergency. Hx of HLD. - Goal SBP < 160 - Continue norvasc, labetalol  Hypokalemia  Hypernatremia - Trend BMP / UOP  - Replace electrolytes as indicated  - Free water   Thrombocytopenia  - Monitor CBC, evidence of bleeding  Nutrition. - Tube feeding per nutrition  DM type II. - SSI /Lantus   BPH  -Continue urecholine in place of flomax - I/O cath as needed   DVT prophylaxis - SCDs SUP - Protonix Goals of care - Full code.    Discussed with Dr. Leonie Man 11/17, he does not believe patient will recover form this and recommending DNR and withdraw but wife has not accepted this yet and continues to wish for support.  Ongoing discussions with neuro regarding prognosis. May need trach next week if we are to continue current level of care.    Tammy Parrett NP-C  [facility]  [phone]   08/06/2016, 9:24 AM   STAFF NOTE: I, Merrie Roof, MD FACP have personally reviewed patient's available data, including medical history, events of note, physical examination and test results as part of my evaluation. I have discussed with resident/NP and other care providers such as pharmacist, RN and RRT. In addition, I personally evaluated patient and elicited key findings of: UNRESPOSNIVE TO VOICE, responds to pain, moves rt ext, pcxr without infiltrate, 10 days poor neuro status, prognosisi recovery poor, last ABG last, consider TV reduction, alk wil not change outcome, weaning now for exercise, will d/w family about futility trach placmement, will need trach Monday if that is decsion vs comfort care, cpap 5 ps 8 today if able, follow TV and apnea alarms, allow na  The patient is critically ill with multiple organ systems failure and requires high complexity decision making for assessment and support, frequent evaluation and titration of therapies, application of advanced monitoring technologies and extensive interpretation of  multiple databases.   Critical Care Time devoted to patient care services described in this note is 30 Minutes. This time reflects time of care of this signee: Merrie Roof, MD FACP. This critical care time does not reflect procedure time, or teaching time or supervisory time of PA/NP/Med student/Med Resident etc but could involve care discussion time. Rest per NP/medical resident whose note is outlined above and that I agree with   Lavon Paganini. Titus Mould, MD, [place] Pgr: [facility] 08/06/2016 12:11 PM

## 2016-08-07 DIAGNOSIS — R402434 Glasgow coma scale score 3-8, 24 hours or more after hospital admission: Secondary | ICD-10-CM

## 2016-08-07 DIAGNOSIS — G8194 Hemiplegia, unspecified affecting left nondominant side: Secondary | ICD-10-CM

## 2016-08-07 DIAGNOSIS — J9600 Acute respiratory failure, unspecified whether with hypoxia or hypercapnia: Secondary | ICD-10-CM

## 2016-08-07 LAB — CBC
HCT: 26.7 % — ABNORMAL LOW (ref 39.0–52.0)
Hemoglobin: 8.6 g/dL — ABNORMAL LOW (ref 13.0–17.0)
MCH: 29.5 pg (ref 26.0–34.0)
MCHC: 32.2 g/dL (ref 30.0–36.0)
MCV: 91.4 fL (ref 78.0–100.0)
PLATELETS: 174 10*3/uL (ref 150–400)
RBC: 2.92 MIL/uL — ABNORMAL LOW (ref 4.22–5.81)
RDW: 14.5 % (ref 11.5–15.5)
WBC: 11.1 10*3/uL — ABNORMAL HIGH (ref 4.0–10.5)

## 2016-08-07 LAB — BASIC METABOLIC PANEL
Anion gap: 9 (ref 5–15)
BUN: 43 mg/dL — AB (ref 6–20)
CALCIUM: 8.5 mg/dL — AB (ref 8.9–10.3)
CO2: 24 mmol/L (ref 22–32)
CREATININE: 0.98 mg/dL (ref 0.61–1.24)
Chloride: 113 mmol/L — ABNORMAL HIGH (ref 101–111)
GFR calc Af Amer: >60 mL/min (ref >60)
GLUCOSE: 217 mg/dL — AB (ref 65–99)
Potassium: 3.7 mmol/L (ref 3.5–5.1)
SODIUM: 146 mmol/L — AB (ref 135–145)

## 2016-08-07 LAB — GLUCOSE, CAPILLARY
GLUCOSE-CAPILLARY: 175 mg/dL — AB (ref 65–99)
GLUCOSE-CAPILLARY: 215 mg/dL — AB (ref 65–99)
Glucose-Capillary: 184 mg/dL — ABNORMAL HIGH (ref 65–99)
Glucose-Capillary: 196 mg/dL — ABNORMAL HIGH (ref 65–99)
Glucose-Capillary: 256 mg/dL — ABNORMAL HIGH (ref 65–99)
Glucose-Capillary: 185 mg/dL — ABNORMAL HIGH (ref 65–99)

## 2016-08-07 NOTE — Progress Notes (Signed)
STROKE TEAM PROGRESS NOTE   SUBJECTIVE (INTERVAL HISTORY) No changes.Afebrile.  . EEG done per family request showing mod diffuse generalized slowing, greater over R cerebral hemisphere with thek nown hemorrhag. No seizures.   Patient remains unresponsive, intubated, no changes since yesterday.I met with sister in law at bedside today and explained poor prognosis and need to make decision about trach/PEG versus comfort care but they are unable to decide at present time..    CBC:   Recent Labs Lab 08/05/16 0456 08/07/16 0119  WBC 10.1 11.1*  HGB 8.5* 8.6*  HCT 26.3* 26.7*  MCV 91.3 91.4  PLT 132* 694    Basic Metabolic Panel:   Recent Labs Lab 08/04/16 0251 08/05/16 0456 08/06/16 0307 08/07/16 0119  NA 152* 150* 150* 146*  K 3.7 3.8  --  3.7  CL 123* 118*  --  113*  CO2 22 23  --  24  GLUCOSE 193* 200*  --  217*  BUN 47* 47*  --  43*  CREATININE 1.01 0.92  --  0.98  CALCIUM 8.3* 8.5*  --  8.5*  MG 2.3 2.4  --   --   PHOS 2.9 3.8  --   --     Lipid Panel:     Component Value Date/Time   CHOL 161 07/25/2016 0228   TRIG 95 07/25/2016 0228   HDL 36 (L) 07/25/2016 0228   CHOLHDL 4.5 07/25/2016 0228   VLDL 19 07/25/2016 0228   LDLCALC 106 (H) 07/25/2016 0228   HgbA1c:  Lab Results  Component Value Date   HGBA1C 8.2 (H) 07/25/2016   Urine Drug Screen:     Component Value Date/Time   LABOPIA NONE DETECTED 08/12/2016 0918   COCAINSCRNUR NONE DETECTED 08/04/2016 0918   LABBENZ NONE DETECTED 08/04/2016 0918   AMPHETMU NONE DETECTED 07/29/2016 0918   THCU NONE DETECTED 08/15/2016 0918   LABBARB NONE DETECTED 07/21/2016 0918      IMAGING I have personally reviewed the radiological images below and agree with the radiology interpretations.  Ct Head Code Stroke W/o Cm 07/27/2016 1. Large acute intraparenchymal hematoma measuring 64 x 53 x 69 mm (estimated volume 115 cc) centered near the right basal ganglia. Localized mass effect with up to 14 mm of  right-to-left shift.  2. Associated intraventricular extension with blood in the lateral and third ventricles. Dilatation of the temporal horns of both lateral ventricles without hydrocephalus.  3. Probable subdural extension with tiny right subdural hematoma measuring up to 3 mm.  4. Underlying chronic microvascular ischemic disease.   CT Head WO Contrast - 07/24/16 1. Unchanged size of large right basal ganglia region parenchymal hemorrhage. Slightly increased edema without increased midline shift. 2. Increased intraventricular hemorrhage with mild interval left lateral ventricular dilatation concerning for developing Hydrocephalus.  Ct Head Wo Contrast 07/27/2016 IMPRESSION: Right basal ganglia and frontal parenchymal hemorrhage with intraventricular extension unchanged. Moderate hydrocephalus unchanged. 7 mm midline shift to the left unchanged.   07/29/2016 IMPRESSION: Right hemispheric hemorrhage unchanged. Intraventricular hemorrhage unchanged. 8 mm midline shift to the left unchanged. Mild hydrocephalus unchanged   Dg Chest Port 1 View 07/25/16  1. Tip of the left IJ catheter is in the projection of the SVC. 2. New right lung opacity, which may reflect asymmetric edema or pneumonia.   07/30/2016 1. Stable support apparatus. 2. Opacity in the right base is mildly more prominent. Recommend follow-up to resolution.  07/31/2016 Stable support apparatus. Mild opacity in the right base, similar in the interval.  Recommend follow-up to resolution.  TEE Left ventricle: The cavity size was normal. Wall thickness was   increased in a pattern of mild LVH. Systolic function was   vigorous. The estimated ejection fraction was in the range of 65%   to 70%. Wall motion was normal; there were no regional wall   motion abnormalities. Doppler parameters are consistent with   abnormal left ventricular relaxation (grade 1 diastolic   dysfunction). - Aortic valve: Trileaflet; mildly thickened  leaflets. - Left atrium: The atrium was mildly dilated. Impressions: - No cardiac source of emboli was indentified.  CT angiogram brain 08/02/16 : 1. 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 The aneurysm is in close proximity to and directed towards the cerebral hematoma, but not clearly contiguous/causative. 2. Size stable right cerebral hematoma with intraventricular extension. Peripheral edema is increased and midline shift now measures 1 cm. Left lateral ventricle dilatation is stable. 3. Intracranial atherosclerosis bilateral high-grade bilateral P3 segment stenoses. PHYSICAL EXAM  Temp:  [99.3 F (37.4 C)-100.7 F (38.2 C)] 99.3 F (37.4 C) (11/19 0800) Pulse Rate:  [74-86] 80 (11/19 1100) Resp:  [14-32] 23 (11/19 1100) BP: (114-142)/(59-77) 120/59 (11/19 1100) SpO2:  [100 %] 100 % (11/19 1100) FiO2 (%):  [30 %-40 %] 30 % (11/19 0807) Weight:  [165 lb 9.1 oz (75.1 kg)] 165 lb 9.1 oz (75.1 kg) (11/19 0412)  General - intubated, sedated. Does not following any commands.   Neuro - intubated, eyes closed, resists eye opening, no respond to voice. Does not follow any commands, PERRL, not blink to visual threat bilaterally, Fundi not visualized. Moves right arm and right leg occasionally. Withdraws to pain on left leg. No movement of left arm. Sensation, coordination and gait not tested.    ASSESSMENT/PLAN Seth Potter is a 65 y.o. 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 He did not receive IV t-PA due to [place].   ICH: Large acute right intraparenchymal hematoma centered near the right basal ganglia, likely due to long-standing HTN and DM. 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  Resultant  Left hemiplegia, drowsiness  CT - Large acute intraparenchymal  hematoma.  Repeat CT stable hematoma but increased edema and developing hydrocephalus  CT repeat 07/27/16 no significant change  CT repeat 07/29/16 no significant change  Carotid Doppler - not ordered  2D Echo - EF 65-70%  LDL - 106  HgbA1c - 8.2  VTE prophylaxis - heparin subq Diet NPO time specified  aspirin 81 mg daily prior to admission, now on No antithrombotic secondary to [place]  Patient counseled to be compliant with his antithrombotic medications  Ongoing aggressive stroke risk factor management  Therapy recommendations:  pending  Disposition:  Pending  Waiting for family to reach a decision whether to proceed with PEG/Trach vs terminally extubate. Today is his 10th day on ETT.  EEG was done per family request to know how much brain activity remains. EEG Showed moderate diffuse generalized slowing.   Cerebral edema  CT showed Large ICH with midline shift  CT repeat 07/27/16 and 07/29/16 no significant change  Off 3% saline   Central line d/c'ed  Na Goal 150-160  Na now 159  On free water now  38mlobulated aneurysm at R MCA bifurct which is near the area of  hemorrhage. Not clear if the hemorrhage was due to ruptured aneurysm vs this is just an incidental finding.   Discussed with Neuro surgery. Angiogram should only be done if family wants to be aggressive. Regardless of whether we do the angiogram, it will not change the outcome for this patient who already had the large intracranial hemorrhage. Any intervention such as aneurysm clipping or other will be done later if patient makes any neurological improvement. Discussed extensively with the family and they will think about how to proceed.  Respiratory failure  Intubated for airway protection  TRACHEAL ASPIRATE growing MSSA - on ancef  On vent  CCM on board  CXR - opacity right lung base  Fever - could be central fever vs pneumonia  CXR - opacity right lung base  Sputum culture showed MSSA on  ancef. Will treat for 7 days total. End date 11/16.  Treat with tylenol.  UA negative   Blood culture NGTD  Diarrhea   Likely due toTF. Will discuss with the nutritionist if we can change this to a less osmolar formulation.  On rectal bag  cdiff negative. Gave imodium starting 11/15.   Hypertension  Stable  BP goal < 160  Off cleviprex  On amlodipine. Holding lisinopril due to AKI.  on labetalol tp 236m tid for BP control  Hyperlipidemia  Home meds:  Fish oil and cinnamon prior to admission  LDL 106 goal < 70  Hold off statin at this time due to large ICH  Diabetes  HgbA1c 8.2, goal < 7.0  Uncontrolled  On insulin drip  SSI  Dysphagia   Did not pass swallow  On TF for nutrition  Other Stroke Risk Factors  Advanced age  Hx of ICH 15 years ago without residue  Other Active Problems  Thrombocytopenia improving.  Anemia stable  - trending slowly. Repeat cbc.   Hypokalemia - replete as needed.   Renal insufficiency - hold lisinopril.   Hyponatremia - was on 3%. Now off but still Na is high. Getting free water  Hospital day # 15    I have personally examined this patient, reviewed notes, independently viewed imaging studies, participated in medical decision making and plan of care.ROS completed by me personally and pertinent positives fully documented  I have made any additions or clarifications directly to the above note.   I spoke to the patient's  sisterin law at bedside today and exolained need to make decision soon on trach peg as patient is already having oral ulcer from ETT and it is not safe to keep it much longer. After rounds she spoke to pt`s wife who agreed for family meeting this pm at 455with wife and children. I informed her I will not be here but request neurohospitalist Dr OShon Haleand PCCM MDs to be there to talk to family today.This patient is critically ill and at significant risk of neurological worsening, death and care requires  constant monitoring of vital signs, hemodynamics,respiratory and cardiac monitoring, extensive review of multiple databases, frequent neurological assessment, discussion with family, other specialists and medical decision making of high complexity.I have made any additions or clarifications directly to the above note.This critical care time does not reflect procedure time, or teaching time or supervisory time of PA/NP/Med Resident etc but could involve care discussion time.  I spent 35 minutes of neurocritical care time  in the care of  this patient.    .Seth Contras MD Medical Director [facility] Pager: [phone]  08/07/2016 11:21 AM

## 2016-08-07 NOTE — Progress Notes (Signed)
Pt's daughter Herbert Seta(Heather) has stated the family has chosen to withdrawal care on Tuesday 11/21 at 0900. They would like to allow everyone to say "goodbye" tomorrow.

## 2016-08-07 NOTE — Progress Notes (Signed)
PULMONARY / CRITICAL CARE MEDICINE   Name: Seth Potter MRN: [id number] DOB: 08/19/1951    ADMISSION DATE:  07/31/2016 CONSULTATION DATE:  07/27/2016  REFERRING MD:  Dr. Roda ShuttersXu, neurology.  CHIEF COMPLAINT:  Acute respiratory failure, unable to protect airway.  HISTORY OF PRESENT ILLNESS:   65 year old male with an acute hypertensive bleed who presents with AMS and inability to protect his airway.  PCCM consulted for intubation and medical management.  SUBJECTIVE:  No events overnight. Remains unresponsive .  Per neuro notes, family discussion 11/18 unable to decide on trach vs comfort care.  Weaning this am   VITAL SIGNS: BP (!) 114/59   Pulse 79   Temp 99.3 F (37.4 C)   Resp (!) 29   Ht 6' (1.829 m) Comment: per wife  Wt 75.1 kg (165 lb 9.1 oz)   SpO2 100%   BMI 22.45 kg/m   VENTILATOR SETTINGS: Vent Mode: PSV FiO2 (%):  [30 %-40 %] 30 % Set Rate:  [14 bmp] 14 bmp Vt Set:  [620 mL] 620 mL PEEP:  [5 cmH20] 5 cmH20 Pressure Support:  [10 cmH20] 10 cmH20 Plateau Pressure:  [13 cmH20-17 cmH20] 13 cmH20  INTAKE / OUTPUT: I/O last 3 completed shifts: In: 3825 [NG/GT:3825] Out: 3625 [Urine:3625]  PHYSICAL EXAMINATION: General: critically ill appearing male in no distress Neuro: Opens eyes to stimuli. /w/d pain  HEENT: ETT in place,  Cardiovascular: regular, no MRG Lungs: resps even non-labored.  coarse bilaterally, decreased in bases  Abdomen:  Soft, non tender Musculoskeletal: no edema Skin: no rashes  LABS:  BMET  Recent Labs Lab 08/04/16 0251 08/05/16 0456 08/06/16 0307 08/07/16 0119  NA 152* 150* 150* 146*  K 3.7 3.8  --  3.7  CL 123* 118*  --  113*  CO2 22 23  --  24  BUN 47* 47*  --  43*  CREATININE 1.01 0.92  --  0.98  GLUCOSE 193* 200*  --  217*    Electrolytes  Recent Labs Lab 08/02/16 0302 08/04/16 0251 08/05/16 0456 08/07/16 0119  CALCIUM 8.3* 8.3* 8.5* 8.5*  MG 2.5* 2.3 2.4  --   PHOS 3.9 2.9 3.8  --     CBC  Recent  Labs Lab 08/04/16 0251 08/05/16 0456 08/07/16 0119  WBC 9.2 10.1 11.1*  HGB 8.5* 8.5* 8.6*  HCT 26.5* 26.3* 26.7*  PLT 122* 132* 174    Coag's No results for input(s): APTT, INR in the last 168 hours.  Sepsis Markers No results for input(s): LATICACIDVEN, PROCALCITON, O2SATVEN in the last 168 hours.  ABG  Recent Labs Lab 08/02/16 0425 08/04/16 0342 08/05/16 0338  PHART 7.544* 7.571* 7.531*  PCO2ART 25.8* 25.3* 28.7*  PO2ART 161* 138* 167*    Liver Enzymes No results for input(s): AST, ALT, ALKPHOS, BILITOT, ALBUMIN in the last 168 hours.  Cardiac Enzymes No results for input(s): TROPONINI, PROBNP in the last 168 hours. Glucose  Recent Labs Lab 08/06/16 1150 08/06/16 1629 08/06/16 1953 08/06/16 2336 08/07/16 0324 08/07/16 0747  GLUCAP 183* 193* 180* 206* 184* 196*   Imaging No results found. STUDIES:  11/7 ECHO >> mild LVH, EF 65 to 70%, grade 1 DD 11/8 CT Head >> increased hemorrhage and midline shift.  CULTURES: 11/5 BC >> negative  11/5 urine >> negative 11/8 BC >>negative 11/8 Sputum >> MSSA 11/11 C-diff >> negative  ANTIBIOTICS: Vancomycin 11/09 >> 11/11 Elita QuickFortaz 11/09 >> 11/11 Ancef 11/11 >> 11/16  SIGNIFICANT EVENTS: 11/04  Admit with  AMS, acute hypertensive bleed 11/08  intubation for airway protection  LINES/TUBES: Lt IJ CVL 11/06 >> 11/11 ETT 11/8 >>  DISCUSSION: 65 year old male with hypertensive ICH now with increased bleeding and midline shift. CT can 11/14 increased edema.  Mental status remains poor with poor overall prognosis.   ASSESSMENT / PLAN:  Acute hypoxic respiratory failure with inability to protect airway in setting of ICH and aspiration pneumonia.  - Wean PEEP / FiO2 for sats >92% - PS trials but no extubation given mental status - Need to decide on trach. Family undecided  At this time  - Finished abx course.   ICH with cerebral edema - poor overall prognosis  - Defer Na management to neuro at this point. -  CT 11/14 as noted  HTN emergency. Hx of HLD. - Goal SBP < 160 - Continue norvasc, labetalol  Hypokalemia -resolved  Hypernatremia- improving  - Trend BMP / UOP  - Replace electrolytes as indicated  - Free water   Thrombocytopenia -resolved  - Monitor CBC, evidence of bleeding  Nutrition. - Tube feeding per nutrition  DM type II. - SSI /Lantus   BPH  -Continue urecholine in place of flomax - I/O cath as needed   DVT prophylaxis - SCDs/Hep sq  SUP - Protonix Goals of care - Full code.    Discussed with Dr. Pearlean BrownieSethi 11/17, he does not believe patient will recover form this and recommending DNR and withdraw but wife has not accepted this yet and continues to wish for support.  Ongoing discussions with neuro regarding prognosis. May need trach next week if we are to continue current level of care.    Tammy Parrett NP-C  [facility]  [phone]   08/07/2016, 10:56 AM   STAFF NOTE: I, Seth Percyaniel Feinstein, MD FACP have personally reviewed patient's available data, including medical history, events of note, physical examination and test results as part of my evaluation. I have discussed with resident/NP and other care providers such as pharmacist, RN and RRT. In addition, I personally evaluated patient and elicited key findings of: remains unresponsive over all no changes in status, no new imaging, no fevers, pcxr without infiltrate, Na noted, allow some drift, no role repeat 3% would not change outcome, weaning sbt cpap 5 ps 5 as goal 6 hours, unable to extubate with neuro  Status, I will try to attend meeting 4 pm for goals of care, can perfrom bedside trach in am if family wishes to suppport further, na in am  The patient is critically ill with multiple organ systems failure and requires high complexity decision making for assessment and support, frequent evaluation and titration of therapies, application of advanced monitoring technologies and extensive  interpretation of multiple databases.   Critical Care Time devoted to patient care services described in this note is 30 Minutes. This time reflects time of care of this signee: Seth Percyaniel Feinstein, MD FACP. This critical care time does not reflect procedure time, or teaching time or supervisory time of PA/NP/Med student/Med Resident etc but could involve care discussion time. Rest per NP/medical resident whose note is outlined above and that I agree with   [name], MD, FACP Pgr: [phone] [facility] 08/07/2016 12:22 PM

## 2016-08-07 NOTE — Progress Notes (Signed)
Neurology Progress Note  Subjective: I was asked by Dr. Leonie Man to attend a family meeting this afternoon at 1600 to discuss goals of care. I have reviewed the patient's chart at length.   In brief, this is a 61-yo man who initially presented to the [facility] ED on 08/05/2016 as a CODE STROKE. He was hunting with his granddaughter when he suddenly dropped his gun and was noted to have left-sided weakness, left gaze deviation and headache. EMS was activated and he was transported to the ED where emergent head CT showed a large R basal ganglia hemorrhage (estimated volume 115 mL) with intraventricular extension and about 14 mm of right-to-left midline shift. ICH score was 3. He was initially lethargic but able to follow commands. Neurosurgery reviewed the case and did not feel that there was any role for surgical intervention. He was admitted to the Neuro ICU for further management. He was initially placed on drips to control blood pressure and hypertonic saline for cerebral edema.   His mental status gradually declined over the next few days and on 11/7 he was no longer able to follow commands. On 11/8, he developed hypoxia with increasing oxygen requirements and was ultimately intubated for airway protection. He has not received any significant sedation throughout this admission and was never on a sedative infusion at any time. He has remained unresponsive since. 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 After discussion amongst neurology, neurosurgery, and neuroradiology, it was ultimately felt that this was unlikely to be the source of the patient's [place]. Further, neurosurgery stated that intervention on this aneurysm would not be warranted unless the patient demonstrated significant functional recovery. EEG was requested by the family and this was performed on 08/04/16. This showed moderate diffuse slowing, greater in the right hemisphere, but no seizures.    The patient's poor prognosis has been relayed to the patient's family by treating providers. He is now at the point where he will require tracheostomy and PEG if aggressive measures are to be continued. He has developed a small wound in his mouth from his ETT. His wife has been unable to make this decision to date. A family meeting has been arranged for 1600 today to discuss goals of care again.   Current Meds:   Current Facility-Administered Medications:  .   stroke: mapping our early stages of recovery book, , Does not apply, Once, Rogue Jury, MD .  acetaminophen (TYLENOL) solution 650 mg, 650 mg, Per Tube, Q4H PRN, Rosalin Hawking, MD, 650 mg at 08/05/16 1203 .  [DISCONTINUED] acetaminophen (TYLENOL) tablet 650 mg, 650 mg, Oral, Q4H PRN, 650 mg at 07/26/16 0512 **OR** acetaminophen (TYLENOL) suppository 650 mg, 650 mg, Rectal, Q4H PRN, Rogue Jury, MD, 650 mg at 07/25/16 1150 .  amLODipine (NORVASC) tablet 10 mg, 10 mg, Oral, Daily, Rosalin Hawking, MD, 10 mg at 08/07/16 0926 .  bethanechol (URECHOLINE) tablet 10 mg, 10 mg, Per Tube, TID, Javier Glazier, MD, 10 mg at 08/07/16 0926 .  chlorhexidine gluconate (MEDLINE KIT) (PERIDEX) 0.12 % solution 15 mL, 15 mL, Mouth Rinse, BID, Rosalin Hawking, MD, 15 mL at 08/07/16 0843 .  dextrose 10 % infusion, , Intravenous, Continuous PRN, Garvin Fila, MD .  feeding supplement (GLUCERNA 1.2 CAL) liquid 1,000 mL, 1,000 mL, Per Tube, Continuous, Rosalin Hawking, MD, Last Rate: 65 mL/hr at 08/07/16 0851, 1,000 mL at 08/07/16 0851 .  feeding supplement (PRO-STAT SUGAR FREE  64) liquid 30 mL, 30 mL, Per Tube, BID, Rosalin Hawking, MD, 30 mL at 08/07/16 0926 .  fentaNYL (SUBLIMAZE) injection 25 mcg, 25 mcg, Intravenous, Q2H PRN, Catha Gosselin, MD, 25 mcg at 08/04/16 1148 .  free water 200 mL, 200 mL, Per Tube, Q4H, Rosalin Hawking, MD, 200 mL at 08/07/16 1200 .  heparin injection 5,000 Units, 5,000 Units, Subcutaneous, Q8H, Rosalin Hawking, MD, 5,000 Units at 08/07/16 0510 .   insulin aspart (novoLOG) injection 2-6 Units, 2-6 Units, Subcutaneous, Q4H, Garvin Fila, MD, 6 Units at 08/07/16 1253 .  insulin aspart (novoLOG) injection 6 Units, 6 Units, Subcutaneous, Q4H, Garvin Fila, MD, 6 Units at 08/07/16 1253 .  insulin glargine (LANTUS) injection 35 Units, 35 Units, Subcutaneous, Q24H, Garvin Fila, MD, 35 Units at 08/07/16 0510 .  labetalol (NORMODYNE) tablet 200 mg, 200 mg, Oral, TID, Rosalin Hawking, MD, 200 mg at 08/07/16 0925 .  loperamide (IMODIUM) capsule 2 mg, 2 mg, Oral, PRN, Dellia Nims, MD, 2 mg at 08/05/16 2124 .  MEDLINE mouth rinse, 15 mL, Mouth Rinse, 10 times per day, Rosalin Hawking, MD, 15 mL at 08/07/16 1200 .  pantoprazole sodium (PROTONIX) 40 mg/20 mL oral suspension 40 mg, 40 mg, Per Tube, Daily, Chesley Mires, MD, 40 mg at 08/07/16 0926 .  senna-docusate (Senokot-S) tablet 1 tablet, 1 tablet, Oral, BID, Rogue Jury, MD, Stopped at 07/28/16 1037  Objective:  Temp:  [99.1 F (37.3 C)-100.6 F (38.1 C)] 99.1 F (37.3 C) (11/19 1200) Pulse Rate:  [74-86] 80 (11/19 1200) Resp:  [14-32] 20 (11/19 1200) BP: (114-142)/(59-77) 123/62 (11/19 1200) SpO2:  [100 %] 100 % (11/19 1200) FiO2 (%):  [30 %-40 %] 30 % (11/19 1147) Weight:  [75.1 kg (165 lb 9.1 oz)] 75.1 kg (165 lb 9.1 oz) (11/19 0412)  General: WDWN lying in ICU bed. He is unresponsive to verbal, tactile, and noxious stimulation. He is not following any commands. No eye opening, spontaneously or otherwise. He has some spontaneous blinking.  HEENT: Neck is supple without lymphadenopathy. ETT and OGT are in place. Sclerae are anicteric. There is no conjunctival injection.  CV: Regular, no murmur. Carotid pulses are 2+ and symmetric with no bruits. Distal pulses 2+ and symmetric.  Lungs: CTAB  Extremities: No C/C/E. Neuro: MS: As noted above. No aphasia.  CN: Pupils are equal and reactive from 3-->2 mm bilaterally. He does not blink to visual threat. His eyes are conjugate with sluggish  oculocephalics. The left corneal is diminished. He has a weak grimace and appears to have some mild asymmetry on the L but his face is partly obscured by tubes and tape. The remainder of his cranial nerves cannot be assessed as he is not able to participate with the exam.  Motor: Normal bulk. Tone is reduced throughout but he is flaccid in the LUE. He moved the R arm spontaneously but this appeared to be non-purposeful. No tremor or other abnormal movements are observed.  Sensation: He has flexion withdrawal to pain in the R with triple flexion of the LLE and no movement of the LUE to nailbed pressure.  DTRs: 2+ on the R, 3+ on the L. Toes are upgoing bilaterally. Coordination and gait: These cannot be assessed as the patient is unable to participate with the exam.   Labs: Lab Results  Component Value Date   WBC 11.1 (H) 08/07/2016   HGB 8.6 (L) 08/07/2016   HCT 26.7 (L) 08/07/2016   PLT 174 08/07/2016   GLUCOSE  217 (H) 08/07/2016   CHOL 161 07/25/2016   TRIG 95 07/25/2016   HDL 36 (L) 07/25/2016   LDLCALC 106 (H) 07/25/2016   ALT 14 (L) 08/05/2016   AST 19 07/30/2016   NA 146 (H) 08/07/2016   K 3.7 08/07/2016   CL 113 (H) 08/07/2016   CREATININE 0.98 08/07/2016   BUN 43 (H) 08/07/2016   CO2 24 08/07/2016   INR 0.99 07/22/2016   HGBA1C 8.2 (H) 07/25/2016   CBC Latest Ref Rng & Units 08/07/2016 08/05/2016 08/04/2016  WBC 4.0 - 10.5 K/uL 11.1(H) 10.1 9.2  Hemoglobin 13.0 - 17.0 g/dL 8.6(L) 8.5(L) 8.5(L)  Hematocrit 39.0 - 52.0 % 26.7(L) 26.3(L) 26.5(L)  Platelets 150 - 400 K/uL 174 132(L) 122(L)    Lab Results  Component Value Date   HGBA1C 8.2 (H) 07/25/2016   Lab Results  Component Value Date   ALT 14 (L) 07/22/2016   AST 19 08/02/2016   ALKPHOS 54 07/22/2016   BILITOT 0.7 08/16/2016    Radiology:  I have personally and independently reviewed the [facility] without contrast from 07/25/2016. This shows a large ICH in the right cerebral hemisphere that appears to originate in the  R basal ganglia. There is mild surrounding edema. There is about 14 mm of right-to-left midline shift. The hemorrhage extends into the ventricular system and involves both lateral ventricles and the third ventricle.   I have personally and independently reviewed the CTA head from 08/02/16. The non-contrast images of the brain continue to show large hematoma in the R cerebral hemisphere that is similar in size to the prior scans but less well-defined, consistent with some breakdown of blood products. Cytotoxic edema surrounds the hemorrhage and there is about 9 mm of right-to-left shift. The blood in the ventricles is less dense but volume appears stable. A small 48m aneurysm is noted at the R MCA bifurcation.   Other diagnostic studies:  EEG 08/04/16: Moderate diffuse generalized slowing (avg frequency 5-6 Hz on L, 4 Hz on R) with poor reactivity and no seizure.   A/P:   1. ICH: This is a massive hemorrhage, most likely hypertensive in etiology, with estimated volume of 115 mL and ICH score of 3. Both of these portend a dismal prognosis for survival and meaningful neurologic recovery. The patient has been unresponsive since 11/7 despite not receiving any sedation. He has not shown any signs of neurologic improvement to day.   2. Coma: He remains comatose and as noted above has been unresponsive since 11/7. This is due to his ICH. There do not appear to be any metabolic derangements or infections that would be expected to inhibit his ability to wake up. EEG did not show any evidence of seizure to suggest this is keeping him down. Persistent coma now 15 days post-hemorrhage confers poor prognosis for recovery.   3. L hemiparesis: This is acute, due to [place] Prognosis poor.   4. R MCA aneurysm: This is an incidental finding that is unlikely to explain his ICH. Given his poor prognosis, there is no role for intervention at this time per consensus of treating teams. I would also agree with this.    I met  with the patient's wife, son, and daughter along with his RN, TLynelle Smoke I reiterated that the patient's prognosis is very poor. They were advised that his hemorrhage is so severe that the most likely outcome with ongoing aggressive support would be an eventual vegetative state. All three state that he had conversations with them  in the past stating that he would not want to be kept alive if he did not have a chance at a meaningful life. They shared a little bit about how well-loved he is by his family, including his children and five grandchildren, as well as their community. It is clear that this is a difficult decision for them but they all support one another.   They have decided that he would not want to have trach or PEG and have elected to proceed with the withdrawal of life-sustaining measures. I briefly discussed this process, including the use of morphine and Ativan for comfort. They were advised that it is difficult to predict how long he may survive once extubated and that he could last anywhere from minutes to days, but I expect it may be towards the longer end of that. They were assured that his comfort will be maintained for however long he survives. I discussed that he will be extubated and watched in the ICU initially and may be transferred to the inpatient hospice unit (6N) and if he looks like he may survive for an extended period. I also told them that I will be consulting palliative care for assistance with end-of-life care. They may benefit from counseling services available through hospice/palliative care for the grandchildren, particularly his granddaughter who was with him when he had his initial event.   They were given the opportunity to ask any questions and these were addressed to their satisfaction. I encouraged them to have any other local family who may want to see him do so before he is extubated. They were asked to notify his RN or staff when they are ready to proceed with  extubation. I have placed the withdrawal of life-sustaining measures order set so these will be available when needed. They were advised that the patient will be made DNR and are in agreement. They voice understanding with the plan as noted. We offered to consult the hospital chaplain but they have deferred at this time.    Melba Coon, MD [facility]

## 2016-08-08 DIAGNOSIS — Z515 Encounter for palliative care: Secondary | ICD-10-CM

## 2016-08-08 DIAGNOSIS — Z7189 Other specified counseling: Secondary | ICD-10-CM

## 2016-08-08 LAB — BASIC METABOLIC PANEL
Anion gap: 10 (ref 5–15)
BUN: 42 mg/dL — AB (ref 6–20)
CALCIUM: 8.7 mg/dL — AB (ref 8.9–10.3)
CO2: 24 mmol/L (ref 22–32)
CREATININE: 0.95 mg/dL (ref 0.61–1.24)
Chloride: 112 mmol/L — ABNORMAL HIGH (ref 101–111)
GFR calc Af Amer: >60 mL/min (ref >60)
GLUCOSE: 216 mg/dL — AB (ref 65–99)
POTASSIUM: 3.9 mmol/L (ref 3.5–5.1)
SODIUM: 146 mmol/L — AB (ref 135–145)

## 2016-08-08 LAB — GLUCOSE, CAPILLARY
GLUCOSE-CAPILLARY: 205 mg/dL — AB (ref 65–99)
Glucose-Capillary: 206 mg/dL — ABNORMAL HIGH (ref 65–99)
Glucose-Capillary: 204 mg/dL — ABNORMAL HIGH (ref 65–99)
Glucose-Capillary: 152 mg/dL — ABNORMAL HIGH (ref 65–99)

## 2016-08-08 LAB — CBC
HCT: 26.1 % — ABNORMAL LOW (ref 39.0–52.0)
Hemoglobin: 8.4 g/dL — ABNORMAL LOW (ref 13.0–17.0)
MCH: 29.4 pg (ref 26.0–34.0)
MCHC: 32.2 g/dL (ref 30.0–36.0)
MCV: 91.3 fL (ref 78.0–100.0)
PLATELETS: 179 10*3/uL (ref 150–400)
RBC: 2.86 MIL/uL — AB (ref 4.22–5.81)
RDW: 14.9 % (ref 11.5–15.5)
WBC: 10.2 10*3/uL (ref 4.0–10.5)

## 2016-08-08 MED ORDER — GLYCOPYRROLATE 0.2 MG/ML IJ SOLN
0.2000 mg | INTRAMUSCULAR | Status: DC | PRN
  Administered 2016-08-10 (×3): 0.2 mg via INTRAVENOUS
  Filled 2016-08-08: qty 1

## 2016-08-08 MED ORDER — INSULIN GLARGINE 100 UNIT/ML ~~LOC~~ SOLN: 40.0000 [IU] | SUBCUTANEOUS | Status: DC

## 2016-08-08 MED ORDER — HALOPERIDOL LACTATE 5 MG/ML IJ SOLN
0.5000 mg | INTRAMUSCULAR | Status: DC | PRN
  Administered 2016-08-10: 0.5 mg via INTRAVENOUS
  Filled 2016-08-08: qty 1

## 2016-08-08 MED ORDER — HALOPERIDOL LACTATE 2 MG/ML PO CONC
0.5000 mg | ORAL | Status: DC | PRN
  Filled 2016-08-08: qty 0.3

## 2016-08-08 MED ORDER — ONDANSETRON 4 MG PO TBDP
4.0000 mg | ORAL_TABLET | Freq: Four times a day (QID) | ORAL | Status: DC | PRN
Start: 2016-08-08 — End: 2016-08-11

## 2016-08-08 MED ORDER — GLYCOPYRROLATE 1 MG PO TABS
1.0000 mg | ORAL_TABLET | ORAL | Status: DC | PRN
  Filled 2016-08-08: qty 1

## 2016-08-08 MED ORDER — SODIUM CHLORIDE 0.9 % IV SOLN
1.0000 mg/h | INTRAVENOUS | Status: DC
  Administered 2016-08-08: 1 mg/h via INTRAVENOUS
  Filled 2016-08-08: qty 10

## 2016-08-08 MED ORDER — ACETAMINOPHEN 650 MG RE SUPP
650.0000 mg | Freq: Four times a day (QID) | RECTAL | Status: DC | PRN
  Administered 2016-08-09 – 2016-08-10 (×3): 650 mg via RECTAL
  Filled 2016-08-08 (×3): qty 1

## 2016-08-08 MED ORDER — GLYCOPYRROLATE 0.2 MG/ML IJ SOLN
0.2000 mg | INTRAMUSCULAR | Status: DC | PRN
  Filled 2016-08-08 (×2): qty 1

## 2016-08-08 MED ORDER — ONDANSETRON HCL 4 MG/2ML IJ SOLN: 4.0000 mg | Freq: Four times a day (QID) | INTRAMUSCULAR | Status: DC | PRN

## 2016-08-08 MED ORDER — INSULIN ASPART 100 UNIT/ML ~~LOC~~ SOLN
9.0000 [IU] | SUBCUTANEOUS | Status: DC
  Administered 2016-08-08: 9 [IU] via SUBCUTANEOUS

## 2016-08-08 MED ORDER — ACETAMINOPHEN 325 MG PO TABS: 650.0000 mg | ORAL_TABLET | Freq: Four times a day (QID) | ORAL | Status: DC | PRN

## 2016-08-08 MED ORDER — POLYVINYL ALCOHOL 1.4 % OP SOLN
1.0000 [drp] | Freq: Four times a day (QID) | OPHTHALMIC | Status: DC | PRN
  Filled 2016-08-08: qty 15

## 2016-08-08 MED ORDER — HALOPERIDOL 1 MG PO TABS
0.5000 mg | ORAL_TABLET | ORAL | Status: DC | PRN
  Filled 2016-08-08: qty 1

## 2016-08-08 MED ORDER — MORPHINE BOLUS VIA INFUSION
2.0000 mg | INTRAVENOUS | Status: DC | PRN
  Filled 2016-08-08: qty 2

## 2016-08-08 MED ORDER — BIOTENE DRY MOUTH MT LIQD: 15.0000 mL | OROMUCOSAL | Status: DC | PRN

## 2016-08-08 NOTE — Progress Notes (Signed)
STROKE TEAM PROGRESS NOTE   SUBJECTIVE (INTERVAL HISTORY) One friends is at bedside. Patient remains unresponsive, intubated, still waiting for decision about trach/PEG versus comfort care.    CBC:   Recent Labs Lab 08/07/16 0119 08/08/16 0258  WBC 11.1* 10.2  HGB 8.6* 8.4*  HCT 26.7* 26.1*  MCV 91.4 91.3  PLT 174 179    Basic Metabolic Panel:   Recent Labs Lab 08/04/16 0251 08/05/16 0456  08/07/16 0119 08/08/16 0258  NA 152* 150*  < > 146* 146*  K 3.7 3.8  --  3.7 3.9  CL 123* 118*  --  113* 112*  CO2 22 23  --  24 24  GLUCOSE 193* 200*  --  217* 216*  BUN 47* 47*  --  43* 42*  CREATININE 1.01 0.92  --  0.98 0.95  CALCIUM 8.3* 8.5*  --  8.5* 8.7*  MG 2.3 2.4  --   --   --   PHOS 2.9 3.8  --   --   --   < > = values in this interval not displayed.  Lipid Panel:     Component Value Date/Time   CHOL 161 07/25/2016 0228   TRIG 95 07/25/2016 0228   HDL 36 (L) 07/25/2016 0228   CHOLHDL 4.5 07/25/2016 0228   VLDL 19 07/25/2016 0228   LDLCALC 106 (H) 07/25/2016 0228   HgbA1c:  Lab Results  Component Value Date   HGBA1C 8.2 (H) 07/25/2016   Urine Drug Screen:     Component Value Date/Time   LABOPIA NONE DETECTED 07/25/2016 0918   COCAINSCRNUR NONE DETECTED 08/10/2016 0918   LABBENZ NONE DETECTED 07/21/2016 0918   AMPHETMU NONE DETECTED 08/13/2016 0918   THCU NONE DETECTED 07/26/2016 0918   LABBARB NONE DETECTED 08/03/2016 0918      IMAGING I have personally reviewed the radiological images below and agree with the radiology interpretations.  Ct Head Code Stroke W/o Cm 08/18/2016 1. Large acute intraparenchymal hematoma measuring 64 x 53 x 69 mm (estimated volume 115 cc) centered near the right basal ganglia. Localized mass effect with up to 14 mm of right-to-left shift.  2. Associated intraventricular extension with blood in the lateral and third ventricles. Dilatation of the temporal horns of both lateral ventricles without hydrocephalus.  3. Probable  subdural extension with tiny right subdural hematoma measuring up to 3 mm.  4. Underlying chronic microvascular ischemic disease.   CT Head WO Contrast - 07/24/16 1. Unchanged size of large right basal ganglia region parenchymal hemorrhage. Slightly increased edema without increased midline shift. 2. Increased intraventricular hemorrhage with mild interval left lateral ventricular dilatation concerning for developing Hydrocephalus.  Ct Head Wo Contrast 07/27/2016 IMPRESSION: Right basal ganglia and frontal parenchymal hemorrhage with intraventricular extension unchanged. Moderate hydrocephalus unchanged. 7 mm midline shift to the left unchanged.   07/29/2016 IMPRESSION: Right hemispheric hemorrhage unchanged. Intraventricular hemorrhage unchanged. 8 mm midline shift to the left unchanged. Mild hydrocephalus unchanged   TEE Left ventricle: The cavity size was normal. Wall thickness was   increased in a pattern of mild LVH. Systolic function was   vigorous. The estimated ejection fraction was in the range of 65%   to 70%. Wall motion was normal; there were no regional wall   motion abnormalities. Doppler parameters are consistent with   abnormal left ventricular relaxation (grade 1 diastolic   dysfunction). - Aortic valve: Trileaflet; mildly thickened leaflets. - Left atrium: The atrium was mildly dilated. Impressions: - No cardiac source of emboli  was indentified.  CT angiogram brain 08/02/16  1. 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 The aneurysm is in close proximity to and directed towards the cerebral hematoma, but not clearly contiguous/causative. 2. Size stable right cerebral hematoma with intraventricular extension. Peripheral edema is increased and midline shift now measures 1 cm. Left lateral ventricle dilatation is stable. 3. Intracranial atherosclerosis bilateral high-grade bilateral P3 segment stenoses.  EEG - mod diffuse generalized slowing, greater over R  cerebral hemisphere with thek nown hemorrhag. No seizures.    PHYSICAL EXAM  Temp:  [98.1 F (36.7 C)-100.2 F (37.9 C)] 100.2 F (37.9 C) (11/20 0800) Pulse Rate:  [76-84] 81 (11/20 0825) Resp:  [14-30] 14 (11/20 0825) BP: (114-133)/(57-65) 133/63 (11/20 0825) SpO2:  [100 %] 100 % (11/20 0825) FiO2 (%):  [30 %] 30 % (11/20 0825) Weight:  [162 lb 14.7 oz (73.9 kg)] 162 lb 14.7 oz (73.9 kg) (11/20 0500)  General - intubated, not on sedation. Fundi not visualized and regular heart rate and rhythm  Neuro - intubated, eyes closed, resists eye opening, no respond to voice. Does not follow any commands, PERRL, not blink to visual threat bilaterally. Right arm extension on pain stimulation. BLE on pain stimulation showed mild DF on toes only. No movement of left arm even with pain stimulation. Sensation, coordination and gait not tested.    ASSESSMENT/PLAN [name] is a 65 y.o. 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 He did not receive IV t-PA due to ICH.   ICH: Large acute right intraparenchymal hematoma centered near the right basal ganglia, likely due to long-standing HTN and DM. 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  Resultant  Left hemiplegia, drowsiness  CT - Large acute intraparenchymal hematoma.  Repeat CT stable hematoma but increased edema and developing hydrocephalus  CT repeat 07/27/16 no significant change  CT repeat 07/29/16 no significant change  CTA 08/02/16 - stable hemorrhage and hydrocephalus. 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   Carotid Doppler - not ordered  2D Echo - EF 65-70%  LDL - 106  HgbA1c - 8.2  VTE prophylaxis - heparin subq Diet NPO time specified  aspirin 81 mg daily prior to  admission, now on No antithrombotic secondary to ICH.  Patient counseled to be compliant with his antithrombotic medications  Ongoing aggressive stroke risk factor management  Therapy recommendations:  pending  Disposition:  Pending  Cerebral edema  CT showed Large ICH with midline shift  CT repeat 07/27/16 and 07/29/16 no significant change  Off 3% saline   Central line d/c'ed  Na normalized now  Respiratory failure  Intubated for airway protection  On vent  CCM on board   Need to decide on trach / PEG  Hypertension  Stable  BP goal < 160  Off cleviprex  On amlodipine. Holding lisinopril due to AKI.  on labetalol tp 200mg  tid for BP control  Hyperlipidemia  Home meds:  Fish oil and cinnamon prior to admission  LDL 106 goal < 70  Hold off statin at this time due to large ICH  Diabetes  HgbA1c 8.2, goal < 7.0  Uncontrolled  DM nurse is following  On lantus, increase to 40U  premeal insulin increase to 9U Q4  SSI  Dysphagia  Did not pass swallow  On TF for nutrition  Other Stroke Risk Factors  Advanced age  Hx of ICH 15 years ago without residue  Other Active Problems  Thrombocytopenia, resolved  Anemia stable.   Hypokalemia - replete as needed.   Hospital day # 16    This patient is critically ill due to large right hemisphere ICH, hypertension, uncontrolled diabetes, central fever and at significant risk of neurological worsening, death form hematoma expansion, cerebral edema, brain herniation, DKA, hypertensive emergency. This patient's care requires constant monitoring of vital signs, hemodynamics, respiratory and cardiac monitoring, review of multiple databases, neurological assessment, discussion with family, other specialists and medical decision making of high complexity. I spent 30 minutes of neurocritical care time in the care of this patient.  Marvel PlanJindong Misha Antonini, MD PhD Stroke Neurology 08/08/2016 9:56 AM

## 2016-08-08 NOTE — Progress Notes (Signed)
PULMONARY / CRITICAL CARE MEDICINE   Name: Seth Potter MRN: [id number] DOB: [birth year]    ADMISSION DATE:  08/02/2016 CONSULTATION DATE:  07/27/2016  REFERRING MD:  Dr. Roda ShuttersXu, neurology.  CHIEF COMPLAINT:  Acute respiratory failure, unable to protect airway.  HISTORY OF PRESENT ILLNESS:   65 year old male with an acute hypertensive bleed who presents with AMS and inability to protect his airway.  PCCM consulted for intubation and medical management.  SUBJECTIVE:  RN reports pt remains unresponsive.  Plan is for withdrawal of care at 0900 tomorrow.    VITAL SIGNS: BP 140/64   Pulse 89   Temp 100.2 F (37.9 C) (Axillary)   Resp 14   Ht 6' (1.829 m) Comment: per wife  Wt 162 lb 14.7 oz (73.9 kg)   SpO2 100%   BMI 22.10 kg/m   VENTILATOR SETTINGS: Vent Mode: PSV;CPAP FiO2 (%):  [30 %] 30 % Set Rate:  [14 bmp] 14 bmp Vt Set:  [620 mL] 620 mL PEEP:  [5 cmH20] 5 cmH20 Pressure Support:  [10 cmH20] 10 cmH20 Plateau Pressure:  [15 cmH20-22 cmH20] 15 cmH20  INTAKE / OUTPUT: I/O last 3 completed shifts: In: 3275 [NG/GT:3275] Out: 3250 [Urine:3250]  PHYSICAL EXAMINATION: General: critically ill appearing male in no distress Neuro: Opens eyes to stimuli. w/d pain, (-) gag HEENT: ETT in place, moist MM Cardiovascular: regular, no MRG Lungs: resps even non-labored.  Coarse bilaterally, decreased in bases, occasional wheeze Abdomen:  Soft, non tender Musculoskeletal: no edema Skin: no rashes  LABS:  BMET  Recent Labs Lab 08/05/16 0456 08/06/16 0307 08/07/16 0119 08/08/16 0258  NA 150* 150* 146* 146*  K 3.8  --  3.7 3.9  CL 118*  --  113* 112*  CO2 23  --  24 24  BUN 47*  --  43* 42*  CREATININE 0.92  --  0.98 0.95  GLUCOSE 200*  --  217* 216*    Electrolytes  Recent Labs Lab 08/02/16 0302 08/04/16 0251 08/05/16 0456 08/07/16 0119 08/08/16 0258  CALCIUM 8.3* 8.3* 8.5* 8.5* 8.7*  MG 2.5* 2.3 2.4  --   --   PHOS 3.9 2.9 3.8  --   --      CBC  Recent Labs Lab 08/05/16 0456 08/07/16 0119 08/08/16 0258  WBC 10.1 11.1* 10.2  HGB 8.5* 8.6* 8.4*  HCT 26.3* 26.7* 26.1*  PLT 132* 174 179    Coag's No results for input(s): APTT, INR in the last 168 hours.  Sepsis Markers No results for input(s): LATICACIDVEN, PROCALCITON, O2SATVEN in the last 168 hours.  ABG  Recent Labs Lab 08/02/16 0425 08/04/16 0342 08/05/16 0338  PHART 7.544* 7.571* 7.531*  PCO2ART 25.8* 25.3* 28.7*  PO2ART 161* 138* 167*    Liver Enzymes No results for input(s): AST, ALT, ALKPHOS, BILITOT, ALBUMIN in the last 168 hours.  Cardiac Enzymes No results for input(s): TROPONINI, PROBNP in the last 168 hours. Glucose  Recent Labs Lab 08/07/16 1128 08/07/16 1532 08/07/16 2001 08/07/16 2328 08/08/16 0326 08/08/16 0827  GLUCAP 256* 175* 185* 215* 205* 206*   Imaging No results found.   STUDIES:  11/7 ECHO >> mild LVH, EF 65 to 70%, grade 1 DD 11/8 CT Head >> increased hemorrhage and midline shift.  CULTURES: 11/5 BC >> negative  11/5 urine >> negative 11/8 BC >>negative 11/8 Sputum >> MSSA 11/11 C-diff >> negative  ANTIBIOTICS: Vancomycin 11/09 >> 11/11 Fortaz 11/09 >> 11/11 Ancef 11/11 >> 11/16  SIGNIFICANT EVENTS: 11/04  Admit with AMS, acute hypertensive bleed 11/08  intubation for airway protection  LINES/TUBES: Lt IJ CVL 11/06 >> 11/11 ETT 11/8 >>  DISCUSSION: 65 year old male with hypertensive ICH now with increased bleeding and midline shift. CT can 11/14 increased edema.  Mental status remains poor with poor overall prognosis.   ASSESSMENT / PLAN:  Acute hypoxic respiratory failure with inability to protect airway in setting of ICH and aspiration pneumonia.  - Wean PEEP / FiO2 for sats >92% - PS trials but no extubation given mental status - Family planning for withdrawal of care in am 11/21  - Finished abx course.   ICH with cerebral edema - poor overall prognosis  - Defer Na management to neuro  at this point. - CT 11/14 as noted  HTN emergency. Hx of HLD. - Goal SBP < 160 - Continue norvasc, labetalol while NGT in place  Hypokalemia -resolved  Hypernatremia- improving  - Trend BMP / UOP  - Replace electrolytes as indicated  - Free water   Thrombocytopenia -resolved  - Monitor CBC, evidence of bleeding  Nutrition. - Tube feeding per nutrition, consider d/c late this evening given plans for extubation in am   DM type II. - SSI /Lantus   BPH  -Continue urecholine in place of flomax while NGT in place - I/O cath as needed   DVT prophylaxis - SCDs/Hep sq  SUP - Protonix Goals of care - Full code.     GLOBAL:  Family planning for withdrawal of care in am 11/21.  No further lab draws.   Family updated at bedside.  Canary BrimBrandi Launa Goedken, NP-C [facility] Pgr: [phone] or if no answer [phone] 08/08/2016, 10:15 AM

## 2016-08-08 NOTE — Progress Notes (Signed)
Inpatient Diabetes Program Recommendations  AACE/ADA: New Consensus Statement on Inpatient Glycemic Control (2015)  Target Ranges:  Prepandial:   less than 140 mg/dL      Peak postprandial:   less than 180 mg/dL (1-2 hours)      Critically ill patients:  140 - 180 mg/dL   Results for [name], Brenen M (MRN [id number]) as of 08/08/2016 09:20  Ref. Range 08/07/2016 07:47 08/07/2016 11:28 08/07/2016 15:32 08/07/2016 20:01 08/07/2016 23:28 08/08/2016 03:26 08/08/2016 08:27  Glucose-Capillary Latest Ref Range: 65 - 99 mg/dL 409196 (H) 811256 (H) 914175 (H) 185 (H) 215 (H) 205 (H) 206 (H)   Review of Glycemic Control  Current orders for Inpatient glycemic control: Lantus 35 units Q24H, Novolog 6 units Q4H for tube feeding coverage, Novolog 2-6 units Q4H  Inpatient Diabetes Program Recommendations:   Insulin - Basal: If appropriate, please consider increasing Lantus to 40 units Q24H. Insulin - Tube Feeding Coverage: If appropriate, please consider increasing tube feeding coverage to Novolog to 9 units Q4H.  NOTE: In reviewing chart, noted that family has decided to withdrawal care on Tuesday 08/09/16. If appropriate, please consider the above noted changes.  Thanks, Seth PennerMarie Saraya Tirey, RN, MSN, CDE Diabetes Coordinator Inpatient Diabetes Program [phone] (Team Pager from 8am to 5pm)

## 2016-08-08 NOTE — Consult Note (Signed)
Consultation Note Date: 08/08/2016   Patient Name: Seth Potter  DOB: 1951/07/25  MRN: [id number]  Age / Sex: 65 y.o., male  PCP: Leonard Downing, MD Referring Physician: Rosalin Hawking, MD  Reason for Consultation: Terminal Care and Withdrawal of life-sustaining treatment  HPI/Patient Profile: 65 y.o. male  with past medical history of DM, hyperlipidemia  admitted on 08/06/2016 with AMS, nonresponsive. Workup revealed CVA with ICH, midline shift. Patient unable to protect airway and was intubated.  Clinical Assessment and Goals of Care: Note that [place] meeting was held with family yesterday and plan is to extubate tomorrow at 9am. Met with patient's sister-in-law at bedside. Family is preparing for tomorrow. Answered questions re: extubation, comfort measures. Gave my contact information and encouraged to call with any further questions.  Returned to meet with patient's wife- Seth Potter. Seth Potter states that she and patient had many conversations regarding his wishes if he were ever in this situation and he would not want further life prolonging measures. Discussed comfort measures and extubation planned for tomorrow morning. Patient enjoyed hunting, fishing, working on his farm, and spending time with his family. Gave emotional support.   Primary Decision Maker NEXT OF KIN- spouse, Seth Potter    SUMMARY OF RECOMMENDATIONS -D/C NG tube -D/C all meds that are not intended for comfort -Extubation planned for tomorrow morning at 0900 -Initiate morphine drip at 1m/hr with bolus doses of 220mq 30 min for SOB or pain -Robinul .79m5mV q4 hours prn secretions -haloperidol .5mg78m q4hrs prn agitation     Code Status/Advance Care Planning:  DNR    Symptom Management:   As above  Palliative Prophylaxis:   Frequent Pain Assessment  Additional Recommendations (Limitations, Scope, Preferences):  Full  Comfort Care, Minimize Medications, No Artificial Feeding, No Blood Transfusions, No Diagnostics, No Glucose Monitoring, No Hemodialysis, No IV Antibiotics, No IV Fluids, No Lab Draws, No Radiation, No Surgical Procedures and No Tracheostomy  Psycho-social/Spiritual:   Desire for further Chaplaincy support:No  Additional Recommendations: Grief/Bereavement Support  Prognosis:    Hours - Days d/t planned extubation of the ventilator dependent patient  Discharge Planning: Anticipated Hospital Death  Primary Diagnoses: Present on Admission: **None**   I have reviewed the medical record, interviewed the patient and family, and examined the patient. The following aspects are pertinent.  Past Medical History:  Diagnosis Date  . Diabetes mellitus without complication (HCC)[place]. Kidney stones    Social History   Social History  . Marital status: Married    Spouse name: N/A  . Number of children: N/A  . Years of education: N/A   Social History Main Topics  . Smoking status: Never Smoker  . Smokeless tobacco: None  . Alcohol use No  . Drug use: No  . Sexual activity: Not Asked   Other Topics Concern  . None   Social History Narrative  . None   No family history on file. Scheduled Meds: .  stroke: mapping our early stages of recovery book   Does  not apply Once  . amLODipine  10 mg Oral Daily  . bethanechol  10 mg Per Tube TID  . chlorhexidine gluconate (MEDLINE KIT)  15 mL Mouth Rinse BID  . feeding supplement (PRO-STAT SUGAR FREE 64)  30 mL Per Tube BID  . free water  200 mL Per Tube Q4H  . heparin subcutaneous  5,000 Units Subcutaneous Q8H  . insulin aspart  2-6 Units Subcutaneous Q4H  . insulin aspart  9 Units Subcutaneous Q4H  . [START ON 08/09/2016] insulin glargine  40 Units Subcutaneous Q24H  . labetalol  200 mg Oral TID  . mouth rinse  15 mL Mouth Rinse 10 times per day  . pantoprazole sodium  40 mg Per Tube Daily  . senna-docusate  1 tablet Oral BID    Continuous Infusions: . dextrose    . feeding supplement (GLUCERNA 1.2 CAL) 1,000 mL (08/08/16 0050)   PRN Meds:.acetaminophen (TYLENOL) oral liquid 160 mg/5 mL, [DISCONTINUED] acetaminophen **OR** acetaminophen, dextrose, fentaNYL (SUBLIMAZE) injection, loperamide Medications Prior to Admission:  Prior to Admission medications   Medication Sig Start Date End Date Taking? Authorizing Provider  aspirin EC 81 MG tablet Take 81 mg by mouth daily.    Yes Historical Provider, MD  CINNAMON PO Take 1 capsule by mouth daily.   Yes Historical Provider, MD  fish oil-omega-3 fatty acids 1000 MG capsule Take 1 g by mouth daily.    Yes Historical Provider, MD  glipiZIDE (GLUCOTROL) 10 MG tablet Take 10 mg by mouth 2 (two) times daily. 06/13/16  Yes Historical Provider, MD  metFORMIN (GLUCOPHAGE) 1000 MG tablet Take 1,000 mg by mouth 2 (two) times daily with a meal.   Yes Historical Provider, MD   No Known Allergies Review of Systems  Unable to perform ROS: Intubated    Physical Exam  Constitutional: He appears well-developed and well-nourished. No distress.  HENT:  Head: Normocephalic.  Cardiovascular: Normal rate and regular rhythm.   Pulmonary/Chest:  Intubated with full assist  Neurological:  Does not arouse  Skin: Skin is warm and dry.  flushed    Vital Signs: BP 140/64   Pulse 89   Temp 100.2 F (37.9 C) (Axillary)   Resp 14   Ht 6' (1.829 m) Comment: per wife  Wt 73.9 kg (162 lb 14.7 oz)   SpO2 100%   BMI 22.10 kg/m  Pain Assessment: CPOT   Pain Score: Asleep   SpO2: SpO2: 100 % O2 Device:SpO2: 100 % O2 Flow Rate: .O2 Flow Rate (L/min): 15 L/min (MD aware of respiratory distress, see notes.)  IO: Intake/output summary:  Intake/Output Summary (Last 24 hours) at 08/08/16 1059 Last data filed at 08/08/16 1000  Gross per 24 hour  Intake             2160 ml  Output             2430 ml  Net             -270 ml    LBM: Last BM Date: 08/08/16 Baseline Weight: Weight:  81.5 kg (179 lb 10.8 oz) Most recent weight: Weight: 73.9 kg (162 lb 14.7 oz)     Palliative Assessment/Data:     Thank you for this consult. Palliative medicine will continue to follow and assist as needed.   Time In: 1001 Time Out: 1020 Time In: 1330 Time Out: 1410 Time Total: 60 minutes Greater than 50%  of this time was spent counseling and coordinating care related to the above  assessment and plan.  Signed by: Mariana Kaufman, AGNP-C Palliative Medicine    Please contact Palliative Medicine Team phone at [phone] for questions and concerns.  For individual provider: See Shea Evans

## 2016-08-08 NOTE — Care Management Important Message (Signed)
Important Message  Patient Details  Name: Seth Potter MRN: [id number] Date of Birth: May 05, 1951   Medicare Important Message Given:  Other (see comment)    Shamaria Kavan Abena 08/08/2016, 10:38 AM

## 2016-08-09 DIAGNOSIS — E119 Type 2 diabetes mellitus without complications: Secondary | ICD-10-CM

## 2016-08-09 DIAGNOSIS — Z515 Encounter for palliative care: Secondary | ICD-10-CM

## 2016-08-09 DIAGNOSIS — I671 Cerebral aneurysm, nonruptured: Secondary | ICD-10-CM

## 2016-08-09 DIAGNOSIS — Z7189 Other specified counseling: Secondary | ICD-10-CM

## 2016-08-09 MED ORDER — LORAZEPAM BOLUS VIA INFUSION
2.0000 mg | INTRAVENOUS | Status: DC | PRN
  Filled 2016-08-09: qty 5

## 2016-08-09 MED ORDER — MIDAZOLAM HCL 5 MG/ML IJ SOLN
10.0000 mg/h | INTRAMUSCULAR | Status: DC
  Filled 2016-08-09: qty 10

## 2016-08-09 MED ORDER — DEXTROSE 5 % IV SOLN
5.0000 mg/h | INTRAVENOUS | Status: DC
  Filled 2016-08-09: qty 25

## 2016-08-09 MED ORDER — SODIUM CHLORIDE 0.9 % IV SOLN
7.0000 mg/h | INTRAVENOUS | Status: DC
  Administered 2016-08-10: 9 mg/h via INTRAVENOUS
  Filled 2016-08-09 (×2): qty 10

## 2016-08-09 MED ORDER — MIDAZOLAM BOLUS VIA INFUSION (WITHDRAWAL LIFE SUSTAINING TX)
5.0000 mg | INTRAVENOUS | Status: DC | PRN
  Filled 2016-08-09: qty 20

## 2016-08-09 MED ORDER — MORPHINE BOLUS VIA INFUSION
5.0000 mg | INTRAVENOUS | Status: DC | PRN
Start: 2016-08-09 — End: 2016-08-09
  Filled 2016-08-09: qty 20

## 2016-08-09 MED ORDER — MORPHINE BOLUS VIA INFUSION
5.0000 mg | INTRAVENOUS | Status: DC | PRN
  Filled 2016-08-09: qty 5

## 2016-08-09 MED ORDER — SCOPOLAMINE 1 MG/3DAYS TD PT72
1.0000 | MEDICATED_PATCH | TRANSDERMAL | Status: DC
  Administered 2016-08-09: 1.5 mg via TRANSDERMAL
  Filled 2016-08-09: qty 1

## 2016-08-09 MED ORDER — LORAZEPAM 2 MG/ML IJ SOLN
2.0000 mg | INTRAMUSCULAR | Status: DC | PRN
  Administered 2016-08-10 (×3): 2 mg via INTRAVENOUS
  Filled 2016-08-09 (×3): qty 1

## 2016-08-09 MED ORDER — MORPHINE BOLUS VIA INFUSION
4.0000 mg | INTRAVENOUS | Status: DC | PRN
  Administered 2016-08-10 – 2016-08-11 (×9): 4 mg via INTRAVENOUS
  Filled 2016-08-09: qty 4

## 2016-08-09 NOTE — Progress Notes (Signed)
Nutrition Brief Note  Chart reviewed. Pt now transitioning to comfort care.  No further nutrition interventions warranted at this time.  Please re-consult as needed.   Shakiya Mcneary RD, LDN, CNSC [phone] Pager [phone] After Hours Pager    

## 2016-08-09 NOTE — Progress Notes (Signed)
Family would like to hold off on the Ativan drip at this time. Palliative care made aware. Patient appears to be resting comfortably at this time.

## 2016-08-09 NOTE — Progress Notes (Signed)
   08/09/16 1000  Clinical Encounter Type  Visited With Patient and family together  Visit Type Spiritual support  Referral From Nurse  Consult/Referral To Chaplain  Spiritual Encounters  Spiritual Needs Emotional  Stress Factors  Patient Stress Factors Not reviewed  Family Stress Factors Family relationships  Stopped by room for introduction to family and Pt. Nurse Pracitioner explaining end of life procedures. Family pastor available during visit. Offered services as needed.

## 2016-08-09 NOTE — Progress Notes (Signed)
STROKE TEAM PROGRESS NOTE   SUBJECTIVE (INTERVAL HISTORY) Family not at bedside, but palliative care team is here for family meeting about comfort care today. Patient able to open eyes today but still intubated and not following commands. As planned, will transition to comfort care today.    CBC:   Recent Labs Lab 08/07/16 0119 08/08/16 0258  WBC 11.1* 10.2  HGB 8.6* 8.4*  HCT 26.7* 26.1*  MCV 91.4 91.3  PLT 174 179    Basic Metabolic Panel:   Recent Labs Lab 08/04/16 0251 08/05/16 0456  08/07/16 0119 08/08/16 0258  NA 152* 150*  < > 146* 146*  K 3.7 3.8  --  3.7 3.9  CL 123* 118*  --  113* 112*  CO2 22 23  --  24 24  GLUCOSE 193* 200*  --  217* 216*  BUN 47* 47*  --  43* 42*  CREATININE 1.01 0.92  --  0.98 0.95  CALCIUM 8.3* 8.5*  --  8.5* 8.7*  MG 2.3 2.4  --   --   --   PHOS 2.9 3.8  --   --   --   < > = values in this interval not displayed.  Lipid Panel:     Component Value Date/Time   CHOL 161 07/25/2016 0228   TRIG 95 07/25/2016 0228   HDL 36 (L) 07/25/2016 0228   CHOLHDL 4.5 07/25/2016 0228   VLDL 19 07/25/2016 0228   LDLCALC 106 (H) 07/25/2016 0228   HgbA1c:  Lab Results  Component Value Date   HGBA1C 8.2 (H) 07/25/2016   Urine Drug Screen:     Component Value Date/Time   LABOPIA NONE DETECTED 08/08/2016 0918   COCAINSCRNUR NONE DETECTED 07/31/2016 0918   LABBENZ NONE DETECTED 07/31/2016 0918   AMPHETMU NONE DETECTED 08/15/2016 0918   THCU NONE DETECTED 07/27/2016 0918   LABBARB NONE DETECTED 07/27/2016 0918      IMAGING I have personally reviewed the radiological images below and agree with the radiology interpretations.  Ct Head Code Stroke W/o Cm 08/14/2016 1. Large acute intraparenchymal hematoma measuring 64 x 53 x 69 mm (estimated volume 115 cc) centered near the right basal ganglia. Localized mass effect with up to 14 mm of right-to-left shift.  2. Associated intraventricular extension with blood in the lateral and third  ventricles. Dilatation of the temporal horns of both lateral ventricles without hydrocephalus.  3. Probable subdural extension with tiny right subdural hematoma measuring up to 3 mm.  4. Underlying chronic microvascular ischemic disease.   CT Head WO Contrast - 07/24/16 1. Unchanged size of large right basal ganglia region parenchymal hemorrhage. Slightly increased edema without increased midline shift. 2. Increased intraventricular hemorrhage with mild interval left lateral ventricular dilatation concerning for developing Hydrocephalus.  Ct Head Wo Contrast 07/27/2016 IMPRESSION: Right basal ganglia and frontal parenchymal hemorrhage with intraventricular extension unchanged. Moderate hydrocephalus unchanged. 7 mm midline shift to the left unchanged.   07/29/2016 IMPRESSION: Right hemispheric hemorrhage unchanged. Intraventricular hemorrhage unchanged. 8 mm midline shift to the left unchanged. Mild hydrocephalus unchanged   TEE Left ventricle: The cavity size was normal. Wall thickness was   increased in a pattern of mild LVH. Systolic function was   vigorous. The estimated ejection fraction was in the range of 65%   to 70%. Wall motion was normal; there were no regional wall   motion abnormalities. Doppler parameters are consistent with   abnormal left ventricular relaxation (grade 1 diastolic   dysfunction). - Aortic  valve: Trileaflet; mildly thickened leaflets. - Left atrium: The atrium was mildly dilated. Impressions: - No cardiac source of emboli was indentified.  CT angiogram brain 08/02/16  1. 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 The aneurysm is in close proximity to and directed towards the cerebral hematoma, but not clearly contiguous/causative. 2. Size stable right cerebral hematoma with intraventricular extension. Peripheral edema is increased and midline shift now measures 1 cm. Left lateral ventricle dilatation is stable. 3. Intracranial atherosclerosis  bilateral high-grade bilateral P3 segment stenoses.  EEG - mod diffuse generalized slowing, greater over R cerebral hemisphere with thek nown hemorrhag. No seizures.    PHYSICAL EXAM  Temp:  [98.2 F (36.8 C)-99.5 F (37.5 C)] 98.7 F (37.1 C) (11/21 0400) Pulse Rate:  [84-88] 85 (11/21 0800) Resp:  [14-20] 15 (11/21 0800) BP: (117-127)/(58-67) 119/61 (11/21 0800) SpO2:  [97 %-100 %] 97 % (11/21 0800) FiO2 (%):  [30 %] 30 % (11/21 0800)  General - intubated, not on sedation. Fundi not visualized and regular heart rate and rhythm  Neuro - intubated, eyes closed, resists eye opening, no respond to voice. Does not follow any commands, PERRL, not blink to visual threat bilaterally. Right arm extension on pain stimulation. BLE on pain stimulation showed mild DF on toes only. No movement of left arm even with pain stimulation. Sensation, coordination and gait not tested.    ASSESSMENT/PLAN Mr. DEQUANN VANDERVELDEN is a 65 y.o. 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 He did not receive IV t-PA due to ICH.   ICH: Large acute right intraparenchymal hematoma centered near the right basal ganglia, likely due to long-standing HTN and DM. 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  Repeat CT stable hematoma but increased edema and developing hydrocephalus  CT repeat 07/27/16 no significant change  CT repeat 07/29/16 no significant change  CTA 08/02/16 - stable hemorrhage and hydrocephalus. 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   Carotid Doppler - not ordered  2D Echo - EF 65-70%  LDL - 106  HgbA1c - 8.2  VTE prophylaxis - heparin subq Diet NPO time  specified  aspirin 81 mg daily prior to admission, now on No antithrombotic secondary to ICH.  Disposition:  Transition to comfort care today  Cerebral edema  CT showed Large ICH with midline shift  CT repeat 07/27/16 and 07/29/16 no significant change  Off 3% saline   Central line d/c'ed  Na normalized now  Transition to comfort care today  Respiratory failure  Intubated for airway protection  On vent  CCM on board   Family declined trach and PEG as per pt wishes  One way extubation today and transition to comfort care  Hypertension  Stable  BP goal < 160  Off cleviprex  Hyperlipidemia  Home meds:  Fish oil and cinnamon prior to admission  LDL 106 goal < 70  Hold off statin at this time due to large ICH  Diabetes  HgbA1c 8.2, goal < 7.0  Uncontrolled  DM nurse is following  On lantus, increase to 40U  premeal insulin increase to 9U Q4  SSI  Will transition to comfort care today  Dysphagia   Did  not pass swallow  Off TF for nutrition  Will transition to comfort care  Other Stroke Risk Factors  Advanced age  Hx of ICH 15 years ago without residue  Other Active Problems  Thrombocytopenia, resolved  Anemia stable.   Hypokalemia - no more labs for comfort care   Hospital day # 17    Marvel PlanJindong Malarie Tappen, MD PhD Stroke Neurology 08/09/2016 10:13 AM

## 2016-08-09 NOTE — Procedures (Signed)
Extubation Procedure Note  Patient Details:   Name: Ardeth PerfectRichard M Blyth DOB: 27-Oct-1950 MRN: [id number]   Airway Documentation:  Airway 8 mm (Active)  Secured at (cm) 24 cm 08/09/2016  4:00 AM  Measured From Lips 08/09/2016  4:00 AM  Secured Location Center 08/09/2016  4:00 AM  Secured By [employer] Tube Holder 08/09/2016  4:00 AM  Tube Holder Repositioned Yes 08/09/2016  4:00 AM  Cuff Pressure (cm H2O) 28 cm H2O 08/08/2016  4:35 PM  Site Condition Dry 08/09/2016  4:00 AM    Evaluation  O2 sats: stable throughout Complications: No apparent complications Patient did tolerate procedure well. Bilateral Breath Sounds: Rhonchi   No   Patient was extubated to a 2L [place] per end of life withdrawal order. No cuff leak was heard. No stridor was noted. RN was at bedside with RT during extubation.  Darolyn Ruashley M Tyniah Kastens 08/09/2016, 10:56 AM

## 2016-08-09 NOTE — Progress Notes (Signed)
Patient arrived to the floor via bed. Patient appears to be resting comfortably. Oxygen at 2L with humidification. Morphine drip infusing at a rate of 6mg /hr. Foley in place. Suction set up to bedside. Family oriented to the room. Will continue to monitor.

## 2016-08-09 NOTE — Progress Notes (Signed)
PULMONARY / CRITICAL CARE MEDICINE   Name: Seth PerfectRichard M Stansel MRN: [id number] DOB: 02/12/1951    ADMISSION DATE:  08/07/2016 CONSULTATION DATE:  07/27/2016  REFERRING MD:  Dr. Roda ShuttersXu, neurology.  CHIEF COMPLAINT:  Acute respiratory failure, unable to protect airway.  HISTORY OF PRESENT ILLNESS:   65 year old male with an acute hypertensive bleed who presents with AMS and inability to protect his airway.  PCCM consulted for intubation and medical management.  SUBJECTIVE:  Family at bedside - questions answered regarding vent removal process.  Morphine gtt in place.   VITAL SIGNS: BP 119/61   Pulse 85   Temp 98.7 F (37.1 C) (Axillary)   Resp 15   Ht 6' (1.829 m) Comment: per wife  Wt 162 lb 14.7 oz (73.9 kg)   SpO2 97%   BMI 22.10 kg/m   VENTILATOR SETTINGS: Vent Mode: PRVC FiO2 (%):  [30 %] 30 % Set Rate:  [14 bmp] 14 bmp Vt Set:  [355[620 mL] 620 mL PEEP:  [5 cmH20] 5 cmH20 Plateau Pressure:  [12 cmH20-16 cmH20] 15 cmH20  INTAKE / OUTPUT: I/O last 3 completed shifts: In: 1515.8 [I.V.:15.8; NG/GT:1500] Out: 2825 [Urine:2825]  PHYSICAL EXAMINATION: General: critically ill appearing male in no distress on vent Neuro: sedate on vent HEENT: ETT in place, moist MM Cardiovascular: regular, no MRG Lungs: resps even non-labored.  Coarse bilaterally, decreased in bases, occasional wheeze Abdomen:  Soft, non tender Musculoskeletal: no edema Skin: no rashes  LABS:  BMET  Recent Labs Lab 08/05/16 0456 08/06/16 0307 08/07/16 0119 08/08/16 0258  NA 150* 150* 146* 146*  K 3.8  --  3.7 3.9  CL 118*  --  113* 112*  CO2 23  --  24 24  BUN 47*  --  43* 42*  CREATININE 0.92  --  0.98 0.95  GLUCOSE 200*  --  217* 216*    Electrolytes  Recent Labs Lab 08/04/16 0251 08/05/16 0456 08/07/16 0119 08/08/16 0258  CALCIUM 8.3* 8.5* 8.5* 8.7*  MG 2.3 2.4  --   --   PHOS 2.9 3.8  --   --     CBC  Recent Labs Lab 08/05/16 0456 08/07/16 0119 08/08/16 0258  WBC 10.1  11.1* 10.2  HGB 8.5* 8.6* 8.4*  HCT 26.3* 26.7* 26.1*  PLT 132* 174 179    Coag's No results for input(s): APTT, INR in the last 168 hours.  Sepsis Markers No results for input(s): LATICACIDVEN, PROCALCITON, O2SATVEN in the last 168 hours.  ABG  Recent Labs Lab 08/04/16 0342 08/05/16 0338  PHART 7.571* 7.531*  PCO2ART 25.3* 28.7*  PO2ART 138* 167*    Liver Enzymes No results for input(s): AST, ALT, ALKPHOS, BILITOT, ALBUMIN in the last 168 hours.  Cardiac Enzymes No results for input(s): TROPONINI, PROBNP in the last 168 hours. Glucose  Recent Labs Lab 08/07/16 2001 08/07/16 2328 08/08/16 0326 08/08/16 0827 08/08/16 1200 08/08/16 1604  GLUCAP 185* 215* 205* 206* 204* 152*   Imaging No results found.   STUDIES:  11/7 ECHO >> mild LVH, EF 65 to 70%, grade 1 DD 11/8 CT Head >> increased hemorrhage and midline shift.  CULTURES: 11/5 BC >> negative  11/5 urine >> negative 11/8 BC >>negative 11/8 Sputum >> MSSA 11/11 C-diff >> negative  ANTIBIOTICS: Vancomycin 11/09 >> 11/11 Elita QuickFortaz 11/09 >> 11/11 Ancef 11/11 >> 11/16  SIGNIFICANT EVENTS: 11/04  Admit with AMS, acute hypertensive bleed 11/08  intubation for airway protection  LINES/TUBES: Lt IJ CVL 11/06 >>  11/11 ETT 11/8 >> 11/21  DISCUSSION: 65 year old male with hypertensive ICH now with increased bleeding and midline shift. CT can 11/14 increased edema.  Mental status remains poor with poor overall prognosis.   ASSESSMENT / PLAN:  Acute hypoxic respiratory failure with inability to protect airway in setting of ICH and aspiration pneumonia.  - withdrawal of aggressive support  - morphine gtt for comfort   ICH with cerebral edema - poor overall prognosis  - no further aggressive interventions  HTN emergency. Hx of HLD. - hold further oral medications  Hypokalemia -resolved  Hypernatremia- improving  - no further lab draws  Thrombocytopenia -resolved   DM type II. - d/c SSI  BPH   - hold further oral medications    GLOBAL:  No further lab draws, transition to full comfort care.  Continue morphine gtt for comfort.  Pending patients status, may transfer out of ICU later in evening for family comfort.   PCCM will be available PRN.  Please call if we can be of further assistance.   Canary BrimBrandi Ollis, NP-C [facility] Pgr: [phone] or if no answer [phone] 08/09/2016, 9:48 AM   Attending note: I have seen and examined the patient with nurse practitioner/resident and agree with the note. History, labs and imaging reviewed.  ICH with poor prognosis for recovery Full comfort measures now. PCCM will sign off. Please call back if needed.  Chilton GreathousePraveen Mantaj Chamberlin MD [facility] Pager [phone] If no answer or after 3pm call: [phone] 08/09/2016, 11:22 AM

## 2016-08-10 DIAGNOSIS — R5081 Fever presenting with conditions classified elsewhere: Secondary | ICD-10-CM

## 2016-08-10 DIAGNOSIS — R509 Fever, unspecified: Secondary | ICD-10-CM

## 2016-08-10 MED ORDER — ACETAMINOPHEN 325 MG PO TABS: 650.0000 mg | ORAL_TABLET | ORAL | Status: DC | PRN

## 2016-08-10 MED ORDER — ACETAMINOPHEN 650 MG RE SUPP
650.0000 mg | RECTAL | Status: DC | PRN
  Administered 2016-08-10 (×2): 650 mg via RECTAL
  Filled 2016-08-10 (×2): qty 1

## 2016-08-10 NOTE — Progress Notes (Addendum)
Daily Progress Note   Patient Name: Ardeth PerfectRichard M Grable       Date: 08/10/2016 DOB: [birth year]  Age: 65 y.o. MRN#: [id number] Attending Physician: Marvel PlanJindong Xu, MD Primary Care Physician: Kaleen MaskELKINS,WILSON OLIVER, MD Admit Date: 07/30/2016  Reason for Consultation/Follow-up: Disposition  Subjective: [name] was terminally extubated yesterday morning and is now fully on comfort care measures. Overnight he developed fevers (presumed terminal fevers), with resultant tachycardia. He remains tachypneic with RR >30. I assessed him at his bedside with his family present.   Length of Stay: 18  Current Medications: Scheduled Meds:  . scopolamine  1 patch Transdermal Q72H    Continuous Infusions: . morphine      PRN Meds: acetaminophen **OR** acetaminophen, glycopyrrolate **OR** glycopyrrolate **OR** glycopyrrolate, haloperidol **OR** haloperidol **OR** haloperidol lactate, LORazepam, morphine, ondansetron **OR** ondansetron (ZOFRAN) IV, polyvinyl alcohol  Physical Exam  Cardiovascular: Tachycardia present.   Pulmonary/Chest: Accessory muscle usage present. Tachypnea noted. He has decreased breath sounds in the right lower field and the left lower field. He has no wheezes. He has rhonchi in the right middle field, the right lower field, the left middle field and the left lower field. He has no rales.  No overt secretion problems noted  Abdominal: Soft. Bowel sounds are decreased.  Neurological:  Pt unresponsive.  Skin:  Skin is dry and hot to touch    Vital Signs: BP (!) 129/52 (BP Location: Left Arm)   Pulse (!) 117   Temp (!) 103.1 F (39.5 C) (Axillary)   Resp (!) 32   Ht 6' (1.829 m) Comment: per wife  Wt 73.9 kg (162 lb 14.7 oz)   SpO2 (!) 88%   BMI 22.10 kg/m  SpO2: SpO2: (!) 88 % O2 Device: O2 Device: Nasal Cannula O2 Flow  Rate: O2 Flow Rate (L/min): 4 L/min  Intake/output summary:  Intake/Output Summary (Last 24 hours) at 08/10/16 [id number] Last data filed at 08/10/16 0515  Gross per 24 hour  Intake            107.3 ml  Output             2500 ml  Net          -2392.7 ml   LBM: Last BM Date: 08/08/16 Baseline Weight: Weight: 81.5 kg (179 lb 10.8 oz) Most recent weight: Weight: 73.9 kg (162 lb 14.7 oz)       Palliative Assessment/Data: PPS 10%   Flowsheet Rows   Flowsheet Row Most Recent Value  Intake Tab  Referral Department  Neurology  Unit at Time of Referral  ICU  Palliative Care Primary Diagnosis  Neurology  Date Notified  08/07/16  Palliative Care Type  New Palliative care  Reason for referral  Clarify Goals of Care  Date of Admission  11-13-15  Date first seen by Palliative Care  08/08/16  # of days Palliative referral response time  1 Day(s)  # of days IP prior to Palliative referral  15  Clinical Assessment  Psychosocial & Spiritual Assessment  Palliative Care Outcomes  Patient/Family meeting held?  Yes  Who was at the meeting?  Spouse- [place]  Palliative Care Outcomes  Changed to focus on comfort, Provided end of life  care assistance  Patient/Family wishes: Interventions discontinued/not started   Mechanical Ventilation, Tube feedings/TPN      Patient Active Problem List   Diagnosis Date Noted  . Diabetes mellitus (HCC)   . Aneurysm, cerebral, nonruptured   . Terminal care   . Goals of care, counseling/discussion   . Palliative care by specialist   . Advance care planning   . Uncontrolled hypertension   . Nontraumatic subcortical hemorrhage of left cerebral hemisphere (HCC)   . Cytotoxic brain edema (HCC) 08/01/2016  . Hyperglycemia   . Respiratory abnormality   . Acute respiratory failure with hypoxia (HCC)   . Intraventricular hemorrhage (HCC) 07/25/2016  . Hemorrhagic stroke (HCC)   . Intracranial hemorrhage (HCC) 08/07/2016    Palliative Care Assessment & Plan    HPI: 65 y.o. male  with past medical history of DM, hyperlipidemia  admitted on 08/09/2016 with AMS, nonresponsive. Workup revealed CVA with ICH, midline shift. Patient unable to protect airway and was intubated. After extensive discussion with family, pt was terminally extubated on 11/21 and full comfort care initiated.   Assessment: Mr. Fayson continues to breath on his own, though with marked increased work of breathing. He is also now experiencing end of life fevers. His family, specifically his wife, reports that any movement causes him to appear extremely uncomfortably, with furrowed brow and sharp increase in his respiratory rate.   Today, I discussed with Mr. Paras family the option to transition him to an in-patient Hospice facility. I explained the benefits of that type of facility--specialized staff well trained in end of life care and aggressive symptom management--and addressed that his window for transition there would likely be limited and based on my assessment of clinical stability for transport. After discussion, the family wished to remain here for the time being. Their biggest concern is the significant discomfort he exhibits with even slight movement. They have also not been on the 6th floor for 24 hours, and feel that frequent movement/transition to different places is emotionally hard, and they are just now starting to become comfortable. More broadly, it is clear they are still struggling with Mr. Denham transition to death, and find more comfort being in the hospital.   Recommendations/Plan: -Continue comfort care measures here for now. I suspect Mr. Noblett will deteriorate quickly in light of his fevers and his body's limited reserves, and would expect death within 24-48 hours. If he remains stable, our team will re-address in-pt Hospice facility with them tomorrow.  -Ongoing tachypnea: I changed his morphine drip to include titration orders, starting at 7mg /hr and increasing  my 1mg  every four hours if he remains symptomatic after utilizing bolus doses. The goal is to achieve pt comfort and RR<20. Instructions in order, and Treatment Team sticky note placed to help clarify this order. Pt's family encouraged to ask for bolus if needed and care nurse educated on order set.  -I expect fever is related to end of life process. This was explained to pt's family members, and I reinforced that our interventions may not reduce his temperature. PRN Tylenol is available and being utilized, as is a cool cloth for non-pharmacologic support.  Goals of Care and Additional Recommendations:  Limitations on Scope of Treatment: Full Comfort Care  Code Status:  DNR  Prognosis:   Hours - Days  Discharge Planning:  Anticipated Hospital Death  Care plan was discussed with pt's family, social work, and Nature conservation officer.  Thank you for allowing the Palliative Medicine Team to assist in  the care of this patient.   Time In: 0900 Time Out: 0935 Total Time 35 minutes Prolonged Time Billed  no       Greater than 50%  of this time was spent counseling and coordinating care related to the above assessment and plan.  Murrell ConverseSarah Rajveer Handler, NP Palliative Medicine Team Team Phone # [phone]

## 2016-08-10 NOTE — Progress Notes (Signed)
LCSW is aware of consult for comfort care. Discussed case with palliative and following for assistance with disposition, symptom management and emotional support.  Will continue to follow.  Deretha EmoryHannah Calley Drenning LCSW, MSW Clinical Social Work: Optician, dispensingystem [employer] Coverage for :  [phone]

## 2016-08-10 NOTE — Progress Notes (Signed)
   08/10/16 0900  Clinical Encounter Type  Visited With Patient and family together  Visit Type Follow-up;Spiritual support;Other (Comment) (End of life)  Referral From Palliative care team  Consult/Referral To Chaplain  Spiritual Encounters  Spiritual Needs Emotional;Grief support  Stress Factors  Patient Stress Factors None identified;Not reviewed  Family Stress Factors Exhausted;Family relationships    Spent time with family making meaning of end of life experience with pt. Family in relatively good spirits, read a favorite verse from a book.

## 2016-08-10 NOTE — Progress Notes (Signed)
STROKE TEAM PROGRESS NOTE   SUBJECTIVE (INTERVAL HISTORY) Wife and friends are at the bedside. Pt has transitioned to full comfort care. On morphine drip and ativan PRN. Still has mild respiratory distress, will also use haldol and increase morphine drip rate.   CBC:   Recent Labs Lab 08/07/16 0119 08/08/16 0258  WBC 11.1* 10.2  HGB 8.6* 8.4*  HCT 26.7* 26.1*  MCV 91.4 91.3  PLT 174 179    Basic Metabolic Panel:   Recent Labs Lab 08/04/16 0251 08/05/16 0456  08/07/16 0119 08/08/16 0258  NA 152* 150*  < > 146* 146*  K 3.7 3.8  --  3.7 3.9  CL 123* 118*  --  113* 112*  CO2 22 23  --  24 24  GLUCOSE 193* 200*  --  217* 216*  BUN 47* 47*  --  43* 42*  CREATININE 1.01 0.92  --  0.98 0.95  CALCIUM 8.3* 8.5*  --  8.5* 8.7*  MG 2.3 2.4  --   --   --   PHOS 2.9 3.8  --   --   --   < > = values in this interval not displayed.  Lipid Panel:     Component Value Date/Time   CHOL 161 07/25/2016 0228   TRIG 95 07/25/2016 0228   HDL 36 (L) 07/25/2016 0228   CHOLHDL 4.5 07/25/2016 0228   VLDL 19 07/25/2016 0228   LDLCALC 106 (H) 07/25/2016 0228   HgbA1c:  Lab Results  Component Value Date   HGBA1C 8.2 (H) 07/25/2016   Urine Drug Screen:     Component Value Date/Time   LABOPIA NONE DETECTED 07/29/2016 0918   COCAINSCRNUR NONE DETECTED 07/29/2016 0918   LABBENZ NONE DETECTED 08/04/2016 0918   AMPHETMU NONE DETECTED 08/12/2016 0918   THCU NONE DETECTED 08/10/2016 0918   LABBARB NONE DETECTED 07/26/2016 0918      IMAGING I have personally reviewed the radiological images below and agree with the radiology interpretations.  Ct Head Code Stroke W/o Cm 07/22/2016 1. Large acute intraparenchymal hematoma measuring 64 x 53 x 69 mm (estimated volume 115 cc) centered near the right basal ganglia. Localized mass effect with up to 14 mm of right-to-left shift.  2. Associated intraventricular extension with blood in the lateral and third ventricles. Dilatation of the temporal  horns of both lateral ventricles without hydrocephalus.  3. Probable subdural extension with tiny right subdural hematoma measuring up to 3 mm.  4. Underlying chronic microvascular ischemic disease.   CT Head WO Contrast - 07/24/16 1. Unchanged size of large right basal ganglia region parenchymal hemorrhage. Slightly increased edema without increased midline shift. 2. Increased intraventricular hemorrhage with mild interval left lateral ventricular dilatation concerning for developing Hydrocephalus.  Ct Head Wo Contrast 07/27/2016 IMPRESSION: Right basal ganglia and frontal parenchymal hemorrhage with intraventricular extension unchanged. Moderate hydrocephalus unchanged. 7 mm midline shift to the left unchanged.   07/29/2016 IMPRESSION: Right hemispheric hemorrhage unchanged. Intraventricular hemorrhage unchanged. 8 mm midline shift to the left unchanged. Mild hydrocephalus unchanged   TEE Left ventricle: The cavity size was normal. Wall thickness was   increased in a pattern of mild LVH. Systolic function was   vigorous. The estimated ejection fraction was in the range of 65%   to 70%. Wall motion was normal; there were no regional wall   motion abnormalities. Doppler parameters are consistent with   abnormal left ventricular relaxation (grade 1 diastolic   dysfunction). - Aortic valve: Trileaflet; mildly thickened leaflets. -  Left atrium: The atrium was mildly dilated. Impressions: - No cardiac source of emboli was indentified.  CT angiogram brain 08/02/16  1. 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 The aneurysm is in close proximity to and directed towards the cerebral hematoma, but not clearly contiguous/causative. 2. Size stable right cerebral hematoma with intraventricular extension. Peripheral edema is increased and midline shift now measures 1 cm. Left lateral ventricle dilatation is stable. 3. Intracranial atherosclerosis bilateral high-grade bilateral P3 segment  stenoses.  EEG - mod diffuse generalized slowing, greater over R cerebral hemisphere with thek nown hemorrhag. No seizures.    PHYSICAL EXAM  Temp:  [99.4 F (37.4 C)-103.1 F (39.5 C)] 103.1 F (39.5 C) (11/22 0441) Pulse Rate:  [103-117] 117 (11/22 0441) Resp:  [22-32] 32 (11/22 0441) BP: (129-141)/(52-66) 129/52 (11/22 0441) SpO2:  [88 %-93 %] 88 % (11/22 0441)  General - intubated, not on sedation. Fundi not visualized and regular heart rate and rhythm  Neuro - intubated, eyes closed, resists eye opening, no respond to voice. Does not follow any commands, PERRL, not blink to visual threat bilaterally. Right arm extension on pain stimulation. BLE on pain stimulation showed mild DF on toes only. No movement of left arm even with pain stimulation. Sensation, coordination and gait not tested.    ASSESSMENT/PLAN Seth Potter is a 65 y.o. 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 He did not receive IV t-PA due to ICH.   ICH: Large acute right intraparenchymal hematoma centered near the right basal ganglia, likely due to long-standing HTN and DM. 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  Repeat CT stable hematoma but increased edema and developing hydrocephalus  CT repeat 07/27/16 no significant change  CT repeat 07/29/16 no significant change  CTA 08/02/16 - stable hemorrhage and hydrocephalus. 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   Carotid Doppler - not ordered  2D Echo - EF 65-70%  LDL - 106  HgbA1c - 8.2  VTE prophylaxis none (hospice) Diet NPO time specified  aspirin 81 mg daily prior to admission, now on No antithrombotic  secondary to ICH.  Disposition:  inpt hospice  Comfort care measuers  Morphine drip - increase due to mild respiratory distress  Ativan Q4   Haldol Q4  Tylenol Q4 for fever  Hypertension  Stable  Hyperlipidemia  Home meds:  Fish oil and cinnamon prior to admission  LDL 106 goal < 70  Diabetes  HgbA1c 8.2, goal < 7.0  Uncontrolled  Other Stroke Risk Factors  Advanced age  Hx of ICH 15 years ago without residue  Other Active Problems     Hospital day # [id number]    Marvel PlanJindong Mardelle Pandolfi, MD PhD Stroke Neurology 08/10/2016 2:23 PM

## 2016-08-15 ENCOUNTER — Telehealth: Payer: Self-pay

## 2016-08-15 NOTE — Telephone Encounter (Signed)
[facility] Rn stated Dr. Roda ShuttersXu filled the death certificate out at the hospital. Lavonda JumboLianne stated she had been trying to call but could not get through. Jenetta DownerLianna stated they have the death certificate already. [facility] Dr. Roda ShuttersXu notified of this.

## 2016-08-19 NOTE — Discharge Summary (Signed)
Stroke Discharge Summary  Patient ID: Seth Potter   MRN: [id number]      DOB: Oct 29, 1950  Date of Admission: 07/28/2016 Date of Discharge: 08/25/16  Attending Physician:  Marvel Plan, MD, Stroke MD Consulting Physician(s):    palliative care  Patient's PCP:  Kaleen Mask, MD  DISCHARGE DIAGNOSIS:  Active Problems:   Intracranial hemorrhage (HCC)   Intraventricular hemorrhage (HCC)   Acute respiratory failure with hypoxia (HCC)   Hyperglycemia   Cytotoxic brain edema (HCC)   Palliative care by specialist   Diabetes mellitus (HCC)   Aneurysm, cerebral, nonruptured   Fever   BMI: Body mass index is 22.1 kg/m.  Past Medical History:  Diagnosis Date  . Diabetes mellitus without complication (HCC)   . Kidney stones    Past Surgical History:  Procedure Laterality Date  . APPENDECTOMY       LABORATORY STUDIES CBC    Component Value Date/Time   WBC 10.2 08/08/2016 0258   RBC 2.86 (L) 08/08/2016 0258   HGB 8.4 (L) 08/08/2016 0258   HCT 26.1 (L) 08/08/2016 0258   PLT 179 08/08/2016 0258   MCV 91.3 08/08/2016 0258   MCH 29.4 08/08/2016 0258   MCHC 32.2 08/08/2016 0258   RDW 14.9 08/08/2016 0258   LYMPHSABS 0.9 07/29/2016 0635   MONOABS 0.4 07/29/2016 0635   EOSABS 0.0 07/29/2016 0635   BASOSABS 0.0 07/29/2016 0635   CMP    Component Value Date/Time   NA 146 (H) 08/08/2016 0258   K 3.9 08/08/2016 0258   CL 112 (H) 08/08/2016 0258   CO2 24 08/08/2016 0258   GLUCOSE 216 (H) 08/08/2016 0258   BUN 42 (H) 08/08/2016 0258   CREATININE 0.95 08/08/2016 0258   CALCIUM 8.7 (L) 08/08/2016 0258   PROT 6.7 08/15/2016 1740   ALBUMIN 3.9 08/14/2016 1740   AST 19 08/03/2016 1740   ALT 14 (L) 08/15/2016 1740   ALKPHOS 54 07/29/2016 1740   BILITOT 0.7 07/22/2016 1740   GFRNONAA >60 08/08/2016 0258   GFRAA >60 08/08/2016 0258   COAGS Lab Results  Component Value Date   INR 0.99 08/09/2016   Lipid Panel    Component Value Date/Time   CHOL 161  07/25/2016 0228   TRIG 95 07/25/2016 0228   HDL 36 (L) 07/25/2016 0228   CHOLHDL 4.5 07/25/2016 0228   VLDL 19 07/25/2016 0228   LDLCALC 106 (H) 07/25/2016 0228   HgbA1C  Lab Results  Component Value Date   HGBA1C 8.2 (H) 07/25/2016   Cardiac Panel (last 3 results) No results for input(s): CKTOTAL, CKMB, TROPONINI, RELINDX in the last 72 hours. Urinalysis    Component Value Date/Time   COLORURINE AMBER (A) 07/28/2016 0542   APPEARANCEUR CLOUDY (A) 07/28/2016 0542   LABSPEC 1.025 07/28/2016 0542   PHURINE 5.0 07/28/2016 0542   GLUCOSEU 100 (A) 07/28/2016 0542   HGBUR NEGATIVE 07/28/2016 0542   BILIRUBINUR NEGATIVE 07/28/2016 0542   KETONESUR NEGATIVE 07/28/2016 0542   PROTEINUR 100 (A) 07/28/2016 0542   NITRITE NEGATIVE 07/28/2016 0542   LEUKOCYTESUR NEGATIVE 07/28/2016 0542   Urine Drug Screen     Component Value Date/Time   LABOPIA NONE DETECTED 08/15/2016 0918   COCAINSCRNUR NONE DETECTED 07/26/2016 0918   LABBENZ NONE DETECTED 07/28/2016 0918   AMPHETMU NONE DETECTED 08/06/2016 0918   THCU NONE DETECTED 07/25/2016 0918   LABBARB NONE DETECTED 08/09/2016 0918    Alcohol Level    Component Value Date/Time   ETH <  5 08/08/2016 2006     SIGNIFICANT DIAGNOSTIC STUDIES Ct Head Code Stroke W/o Cm 07/21/2016 1. Large acute intraparenchymal hematoma measuring 64 x 53 x 69 mm (estimated volume 115 cc) centered near the right basal ganglia. Localized mass effect with up to 14 mm of right-to-left shift.  2. Associated intraventricular extension with blood in the lateral and third ventricles. Dilatation of the temporal horns of both lateral ventricles without hydrocephalus.  3. Probable subdural extension with tiny right subdural hematoma measuring up to 3 mm.  4. Underlying chronic microvascular ischemic disease.   CT Head WO Contrast - 07/24/16 1. Unchanged size of large right basal ganglia region parenchymal hemorrhage. Slightly increased edema without increased  midline shift. 2. Increased intraventricular hemorrhage with mild interval left lateral ventricular dilatation concerning for developing Hydrocephalus.  Ct Head Wo Contrast 07/27/2016 IMPRESSION: Right basal ganglia and frontal parenchymal hemorrhage with intraventricular extension unchanged. Moderate hydrocephalus unchanged. 7 mm midline shift to the left unchanged.   07/29/2016 IMPRESSION: Right hemispheric hemorrhage unchanged. Intraventricular hemorrhage unchanged. 8 mm midline shift to the left unchanged. Mild hydrocephalus unchanged   TEE Left ventricle: The cavity size was normal. Wall thickness was increased in a pattern of mild LVH. Systolic function was vigorous. The estimated ejection fraction was in the range of 65% to 70%. Wall motion was normal; there were no regional wall motion abnormalities. Doppler parameters are consistent with abnormal left ventricular relaxation (grade 1 diastolic dysfunction). - Aortic valve: Trileaflet; mildly thickened leaflets. - Left atrium: The atrium was mildly dilated. Impressions: - No cardiac source of emboli was indentified.  CT angiogram brain 08/02/16  1. 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 The aneurysm is in close proximity to and directed towards the cerebral hematoma, but not clearly contiguous/causative. 2. Size stable right cerebral hematoma with intraventricular extension. Peripheral edema is increased and midline shift now measures 1 cm. Left lateral ventricle dilatation is stable. 3. Intracranial atherosclerosis bilateral high-grade bilateral P3 segment stenoses.  EEG - mod diffuse generalized slowing, greater over R cerebral hemisphere with thek nown hemorrhag. No seizures.     HISTORY OF PRESENT ILLNESS Slumped over during hunting and became unresponsive.  BP 220/115 upon arrival.  No prior history of hypertension.  Did have history of DM and high cholesterol.  CT shows large BG ICH with 14  mm midline shift, IVH, but no hydrocephalus.  Neurosurgery was called and they did not recommend any acute surgical intervention.  HOSPITAL COURSE Seth Potter is a 65 y.o. 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  CT showed large right ICH with IVH. Initially pt able to maintain his airway but on day 4 he was intubated for aspiration pneumonia and not able to protect airway. He was also had central line for hypertonic saline. Na remained at 160 and he did not show any sign of herniation. However, he developed fever and was put on Abx for ventilation related pneumonia. Pt gradually stabilized but still not following commands. Still has spontaneous movement at right UE but not much movement at other limbs. He remained on ventilation for 13 days and family declined trach and PEG as per pt wishes. Palliative care consulted and pt was extubated on 08/09/16 with full comfort care. Pt passed away on 08/02/2016.    ICH: Large acute right intraparenchymal hematoma centered near the right basal ganglia, likely due to long-standing HTN  and DM.   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  Repeat CT stable hematoma but increased edema and developing hydrocephalus  CT repeat 07/27/16 no significant change  CT repeat 07/29/16 no significant change  CTA 08/02/16 - stable hemorrhage and hydrocephalus. 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   Carotid Doppler - not ordered  2D Echo - EF 65-70%  LDL - 106  HgbA1c - 8.2  VTE prophylaxis none (hospice)  Diet NPO time specified  aspirin 81 mg daily prior to admission, now on No antithrombotic secondary to ICH.  Comfort care measuers  Morphine drip - increase due to mild respiratory distress  Ativan Q4   Haldol Q4  Tylenol Q4 for  fever  R MCA bifurct 3mm lobulated aneurysm  Incidental finding  No treatment indicated  Not related to current ICH  Hypertension  Stable  Hyperlipidemia  Home meds:  Fish oil and cinnamon prior to admission  LDL 106 goal < 70  Diabetes  HgbA1c 8.2, goal < 7.0  Uncontrolled  Other Stroke Risk Factors  Advanced age  Hx of ICH 15 years ago without residue  DISCHARGE EXAM Pt deceased   25 minutes were spent preparing discharge.  Marvel PlanJindong Jewell Ryans, MD PhD Stroke Neurology 2016/02/07 2:08 PM

## 2016-08-19 NOTE — Progress Notes (Signed)
Patient's body sent to the morgue with death certificate.

## 2016-08-19 NOTE — Progress Notes (Signed)
Patient expired at 10:30 today, patient is DNR, no pulse, no respirations, pooling of blood in feet and hands, family members was at bedside at time of death, physician was notified, Robbie Liscarolina donors also notified.

## 2016-08-19 NOTE — Progress Notes (Signed)
Daily Progress Note   Patient Name: Seth Potter       Date: 2016-08-25 DOB: 09/30/1950  Age: 65 y.o. MRN#: [id number] Attending Physician: Marvel Plan, MD Primary Care Physician: Kaleen Mask, MD Admit Date: 07/30/2016  Reason for Consultation/Follow-up: Disposition  Subjective: Seth Potter was terminally extubated on 11/21 and remains on full comfort care measures. Since extubation he has been persistently febrile (presumed end of life fever) and has struggled with tachypnea. Extensive education done with care nurse on 11/22 regarding morphine infusion and importance of use of bolus morphine doses to control immediate symptoms. Today, he does appear more comfortable with less work of breathing. Per his family, they noticed a change in his breathing overnight--becoming slower and more shallow. I explained how that is a normal and expected change as someone is nearing death.   Length of Stay: 19  Current Medications: Scheduled Meds:  . scopolamine  1 patch Transdermal Q72H    Continuous Infusions: . morphine 9 mg/hr (08/10/16 2022)    PRN Meds: acetaminophen **OR** acetaminophen, glycopyrrolate **OR** glycopyrrolate **OR** glycopyrrolate, haloperidol **OR** haloperidol **OR** haloperidol lactate, LORazepam, morphine, ondansetron **OR** ondansetron (ZOFRAN) IV, polyvinyl alcohol  Physical Exam  Cardiovascular: Tachycardia present.   Pulmonary/Chest: No accessory muscle usage. Tachypnea (improved from yesterday) noted. He has decreased breath sounds in the right lower field and the left lower field. He has no wheezes. He has rhonchi in the right middle field, the right lower field, the left middle field and the left lower field. He has no rales.  No overt secretion problems noted. Breaths now shallow, though still rhythmic.     Abdominal: Soft. Bowel sounds are decreased.  Neurological:  Pt unresponsive.  Skin: Skin is warm and dry.    Vital Signs: BP (!) 78/39 (BP Location: Left Arm)   Pulse (!) 122   Temp (!) 101.7 F (38.7 C) (Axillary)   Resp (!) 30   Ht 6' (1.829 m) Comment: per wife  Wt 73.9 kg (162 lb 14.7 oz)   SpO2 (!) 87%   BMI 22.10 kg/m  SpO2: SpO2: (!) 87 % O2 Device: O2 Device: Nasal Cannula O2 Flow Rate: O2 Flow Rate (L/min): 4 L/min  Intake/output summary:   Intake/Output Summary (Last 24 hours) at 2016/08/25 7829 Last data filed at 08/25/16 5621  Gross per 24 hour  Intake                0 ml  Output             1500 ml  Net            -1500 ml   LBM: Last BM Date: 08/08/16 Baseline Weight: Weight: 81.5 kg (179 lb 10.8 oz) Most recent weight: Weight: 73.9 kg (162 lb 14.7 oz)       Palliative Assessment/Data: PPS 10%   Flowsheet Rows   Flowsheet Row Most Recent Value  Intake Tab  Referral Department  Neurology  Unit at Time of Referral  ICU  Palliative Care Primary Diagnosis  Neurology  Date Notified  08/07/16  Palliative Care Type  New Palliative care  Reason for referral  Clarify Goals of Care  Date of Admission  07/22/2016  Date first seen by Palliative Care  08/08/16  # of days Palliative referral response time  1 Day(s)  # of days IP prior to Palliative referral  15  Clinical Assessment  Psychosocial & Spiritual Assessment  Palliative Care Outcomes  Patient/Family meeting held?  Yes  Who was at the meeting?  Spouse- Beverly  Palliative Care Outcomes  Changed to focus on comfort, Provided end of life care assistance  Patient/Family wishes: Interventions discontinued/not started   Mechanical Ventilation, Tube feedings/TPN      Patient Active Problem List   Diagnosis Date Noted  . Fever   . Diabetes mellitus (HCC)   . Aneurysm, cerebral, nonruptured   . Terminal care   . Goals of care, counseling/discussion   . Palliative care by specialist   . Advance care  planning   . Uncontrolled hypertension   . Nontraumatic subcortical hemorrhage of left cerebral hemisphere (HCC)   . Cytotoxic brain edema (HCC) 08/01/2016  . Hyperglycemia   . Respiratory abnormality   . Acute respiratory failure with hypoxia (HCC)   . Intraventricular hemorrhage (HCC) 07/25/2016  . Hemorrhagic stroke (HCC)   . Intracranial hemorrhage (HCC) 08-29-2016    Palliative Care Assessment & Plan   HPI: 65 y.o. male  with past medical history of DM, hyperlipidemia  admitted on 08/01/2016 with AMS, nonresponsive. Workup revealed CVA with ICH, midline shift. Patient unable to protect airway and was intubated. After extensive discussion with family, pt was terminally extubated on 11/21 and full comfort care initiated.   Assessment: Seth Potter is starting to transition towards death. His breathing has become more shallow and both his blood pressure and O2 saturation have started to decline. Fortunately, he does appear more comfortable compared to yesterday, with a decrease in his overall work of breathing and physically appearing more relaxed. His family is also appearing more comfortable, as they were very anxious and upset with his presentation yesterday. In light of his decline, I do not believe he is stable for transport to a residential Hospice facility. This can be re-addressed tomorrow if he stabilizes, though I expect he will die today or tomorrow.   Recommendations/Plan: -Continue comfort care measures here. I suspect Mr. Fruchter will deteriorate quickly in light of his fevers and his body's limited reserves, and would expect death within 24-48 hours. If he remains stable, our team will re-address in-pt Hospice facility with them. -Ongoing tachypnea: Continue his morphine drip to include titration orders, starting at 7mg /hr and increasing my 1mg  every four hours if he remains symptomatic after utilizing bolus doses. The goal is to achieve pt comfort and RR<20. Instructions in order,  and Treatment Team sticky note placed to help clarify this order. Pt's family encouraged to ask for bolus if needed and care nurse educated on order set.  -I expect fever is related to end of life process. This was explained to pt's family members, and I reinforced that our interventions may not reduce his temperature. PRN Tylenol is available and being utilized, as is a cool cloth for non-pharmacologic support. He does appear to be less feverish today, which his family is comforted by.  Goals of Care and Additional Recommendations:  Limitations on Scope of Treatment: Full Comfort Care  Code Status:  DNR  Prognosis:   Hours - Days  Discharge Planning:  Anticipated Hospital Death  Care plan was discussed with pt's family, social work, and [employer] officercare RN.  Thank you for allowing the Palliative Medicine Team to assist in the care of  this patient.   Time In: 0830 Time Out: 0845 Total Time 15 minutes Prolonged Time Billed  no       Greater than 50%  of this time was spent counseling and coordinating care related to the above assessment and plan.  Murrell ConverseSarah Geddy [place], NP Palliative Medicine Team Team Phone # [phone]

## 2016-08-19 DEATH — deceased

## 2018-08-07 IMAGING — CR DG CHEST 1V PORT
1 series · 1 of 1 positions shown · non-contrast
Comparison: 08/02/2016

CLINICAL DATA: Respiratory failure.

EXAM:
PORTABLE CHEST 1 VIEW

[AP]
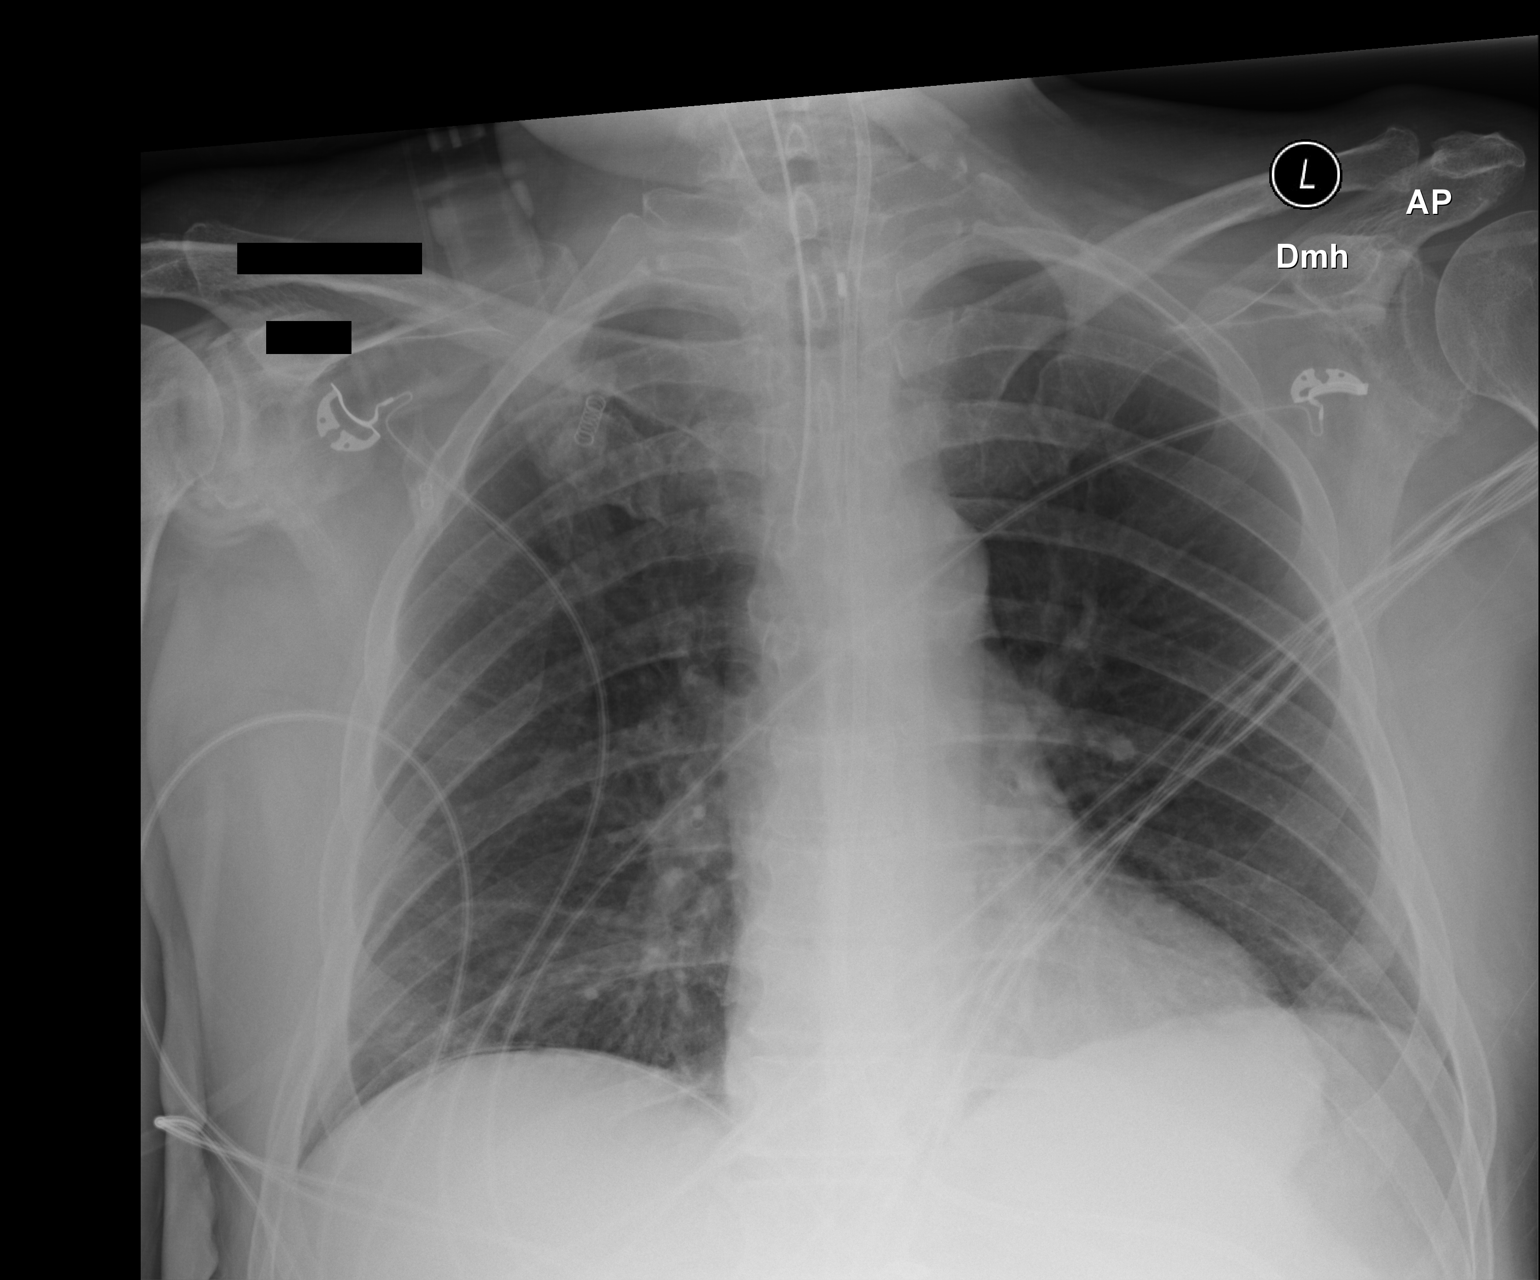

[1 of 1 positions shown; findings below may reference images not displayed]

FINDINGS: Endotracheal tube terminates approximately 5 cm above the carina.
Enteric tube courses into the left upper abdomen with tip not
imaged. Cardiomediastinal silhouette is within normal limits.
Minimal left basilar opacity likely reflects atelectasis. The lungs
are otherwise clear. No sizable pleural effusion or pneumothorax is
identified.
IMPRESSION: Minimal left basilar atelectasis.

## 2018-08-08 IMAGING — CR DG CHEST 1V PORT
1 series · 1 of 1 positions shown · non-contrast
Comparison: 08/03/2016

CLINICAL DATA: History of ET tube placement

EXAM:
PORTABLE CHEST 1 VIEW

[AP]
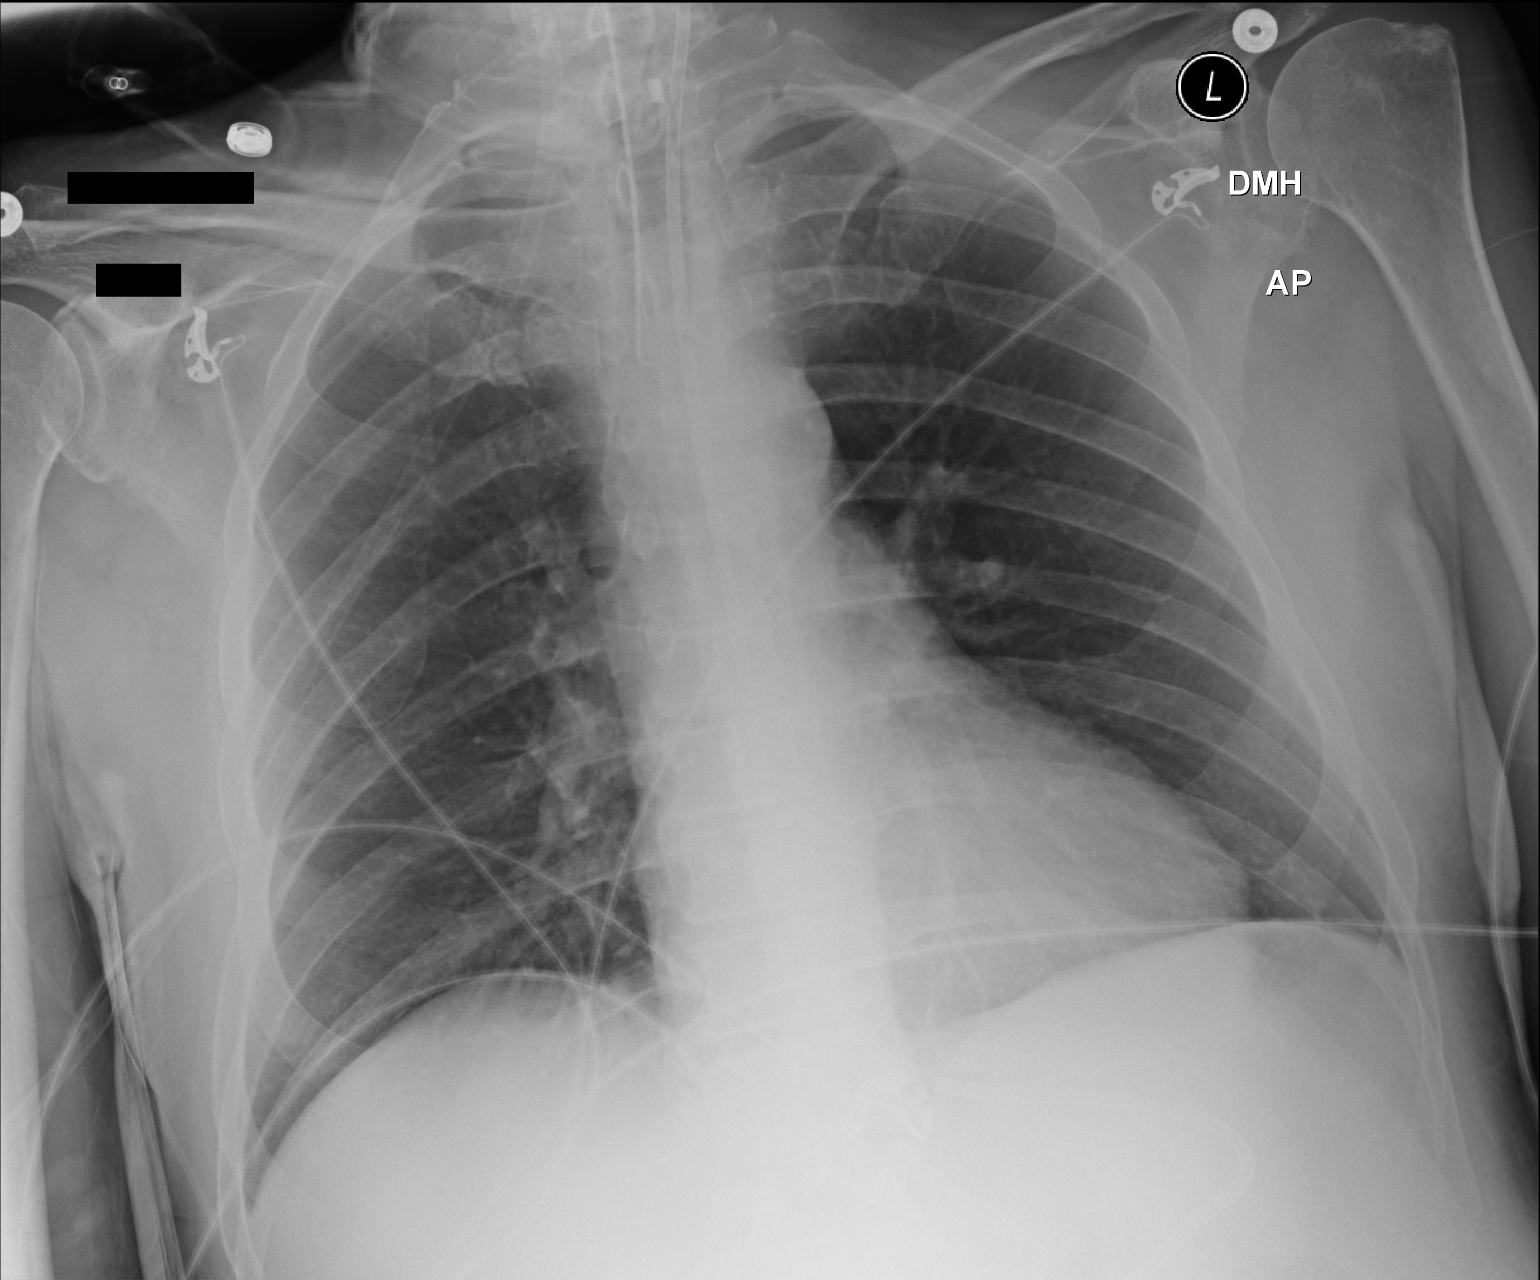

[1 of 1 positions shown; findings below may reference images not displayed]

FINDINGS: The ET tube tip is above the carina. There is a feeding tube with
tip below the GE junction. Normal heart size. Lung volumes are low.
No airspace opacities.
IMPRESSION: 1. Stable position of ET tube with tip above carina.
